# Patient Record
Sex: Female | Born: 1995 | Race: White | Hispanic: No | Marital: Single | Smoking: Former smoker
Health system: Southern US, Community
[De-identification: ages and names within clinical notes are randomized; demographics above are authoritative.]

## PROBLEM LIST (undated history)

## (undated) ENCOUNTER — Inpatient Hospital Stay (HOSPITAL_COMMUNITY): Payer: Self-pay

## (undated) DIAGNOSIS — N390 Urinary tract infection, site not specified: Secondary | ICD-10-CM

## (undated) DIAGNOSIS — F419 Anxiety disorder, unspecified: Secondary | ICD-10-CM

## (undated) DIAGNOSIS — F32A Depression, unspecified: Secondary | ICD-10-CM

## (undated) DIAGNOSIS — Z789 Other specified health status: Secondary | ICD-10-CM

## (undated) DIAGNOSIS — I499 Cardiac arrhythmia, unspecified: Secondary | ICD-10-CM

## (undated) DIAGNOSIS — G43909 Migraine, unspecified, not intractable, without status migrainosus: Secondary | ICD-10-CM

## (undated) DIAGNOSIS — B009 Herpesviral infection, unspecified: Secondary | ICD-10-CM

## (undated) HISTORY — PX: TYMPANOSTOMY TUBE PLACEMENT: SHX32

---
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
## 2017-01-29 ENCOUNTER — Ambulatory Visit (INDEPENDENT_AMBULATORY_CARE_PROVIDER_SITE_OTHER): Payer: Medicaid Other | Admitting: Family

## 2017-01-29 ENCOUNTER — Encounter: Payer: Self-pay | Admitting: Family

## 2017-01-29 VITALS — BP 124/83 | Ht 63.98 in | Wt 161.0 lb

## 2017-01-29 DIAGNOSIS — Z3046 Encounter for surveillance of implantable subdermal contraceptive: Secondary | ICD-10-CM

## 2017-01-29 DIAGNOSIS — Z113 Encounter for screening for infections with a predominantly sexual mode of transmission: Secondary | ICD-10-CM

## 2017-01-29 DIAGNOSIS — Z30017 Encounter for initial prescription of implantable subdermal contraceptive: Secondary | ICD-10-CM

## 2017-01-29 MED ORDER — ETONOGESTREL 68 MG ~~LOC~~ IMPL
68.0000 mg | DRUG_IMPLANT | Freq: Once | SUBCUTANEOUS | Status: AC
  Administered 2017-01-29: 68 mg via SUBCUTANEOUS

## 2017-01-29 NOTE — Progress Notes (Signed)
THIS RECORD MAY CONTAIN CONFIDENTIAL INFORMATION THAT SHOULD NOT BE RELEASED WITHOUT REVIEW OF THE SERVICE PROVIDER.  Adolescent Medicine Consultation Follow-Up Visit Lauren [name]  is a 21 y.o. female referred by No ref. provider found here today for follow-up regarding nexplanon removal and reinsertion.    Last seen in [facility] on 05/17/14 for BTB on nexplanon.  Plan at last visit included reassurance re: BTB Nexplanon.   - Pertinent Labs? No - Growth Chart Viewed? no   History was provided by the patient.  PCP Confirmed?  yes  My Chart Activated?   no    Chief Complaint  Patient presents with  . Follow-up  . Contraception    nexplanon remove and replace.    HPI:    Needs nexplanon replaced. Would like to have nexplanon reinserted.  No other complaints.   No LMP recorded. Patient has had an implant. Allergies  Allergen Reactions  . Septra [Bactrim] Nausea And Vomiting  . Sulfa Antibiotics Anaphylaxis and Nausea And Vomiting  . Prednisone Other (See Comments)    Extreme crying  . Other Itching, Swelling and Rash    TACO SEASONING ([employer] BRAND FROM [employer])   Outpatient Medications Prior to Visit  Medication Sig Dispense Refill  . etonogestrel (NEXPLANON) 68 MG IMPL implant 68 mg by Subdermal route once. IMPLANTED EVERY 3 YEARS    . azithromycin (ZITHROMAX) 250 MG tablet Take 1 tablet (250 mg total) by mouth daily. Take first 2 tablets together, then 1 every day until finished. (Patient not taking: Reported on 09/14/2016) 6 tablet 0  . benzonatate (TESSALON) 100 MG capsule Take 2 capsules (200 mg total) by mouth 2 (two) times daily as needed for cough. (Patient not taking: Reported on 09/14/2016) 20 capsule 0  . cephALEXin (KEFLEX) 500 MG capsule Take 1 capsule (500 mg total) by mouth 3 (three) times daily. (Patient not taking: Reported on 01/29/2017) 21 capsule 0  . lidocaine (XYLOCAINE) 2 % solution Use as directed 20 mLs in the mouth or throat  as needed for mouth pain. (Patient not taking: Reported on 09/14/2016) 100 mL 0  . loratadine (CLARITIN) 10 MG tablet Take 10 mg by mouth daily.    . methocarbamol (ROBAXIN) 500 MG tablet Take 1 tablet (500 mg total) by mouth 2 (two) times daily. (Patient not taking: Reported on 09/14/2016) 20 tablet 0  . naproxen (NAPROSYN) 500 MG tablet Take 1 tablet (500 mg total) by mouth 2 (two) times daily. (Patient not taking: Reported on 09/14/2016) 30 tablet 0  . nitrofurantoin, macrocrystal-monohydrate, (MACROBID) 100 MG capsule Take 1 capsule (100 mg total) by mouth 2 (two) times daily. (Patient not taking: Reported on 09/14/2016) 14 capsule 0  . oxymetazoline (AFRIN NASAL SPRAY) 0.05 % nasal spray Place 1 spray into both nostrils 2 (two) times daily. Do not use spray for more than 3 days to prevent rebound rhinorrhea. (Patient not taking: Reported on 09/14/2016) 30 mL 0  . phenazopyridine (PYRIDIUM) 200 MG tablet Take 1 tablet (200 mg total) by mouth 3 (three) times daily. (Patient not taking: Reported on 09/14/2016) 6 tablet 0  . sodium chloride (OCEAN) 0.65 % SOLN nasal spray Place 1 spray into both nostrils as needed for congestion. (Patient not taking: Reported on 01/29/2017) 480 mL 0  . traMADol (ULTRAM) 50 MG tablet Take 1 tablet (50 mg total) by mouth 2 (two) times daily. (Patient not taking: Reported on 09/14/2016) 10 tablet 0   No facility-administered medications prior to visit.  Patient Active Problem List   Diagnosis Date Noted  . Migraine without aura and without status migrainosus, not intractable 02/26/2015  . Episodic tension-type headache, not intractable 02/26/2015  . Poor sleep hygiene 02/26/2015  . Presence of subdermal contraceptive implant 03/08/2014  . Irregular menses 03/08/2014  . Acne 03/08/2014  . Chronic headaches 10/28/2013  . 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 Problems with learning 09/21/2013    The following portions of the patient's history were reviewed and updated as appropriate: allergies, current medications, past medical history and problem list.  Physical Exam:  Vitals:   01/29/17 1507  BP: 124/83  Weight: 161 lb (73 kg)  Height: 5' 3.98" (1.625 m)   BP 124/83 (BP Location: Right Arm, Patient Position: Sitting, Cuff Size: Large)   Ht 5' 3.98" (1.625 m)   Wt 161 lb (73 kg)   BMI 27.66 kg/m  Body mass index: body mass index is 27.66 kg/m. Blood pressure percentiles are 91 % systolic and 96 % diastolic based on NHBPEP's 4th Report. Blood pressure percentile targets: 90: 123/78, 95: 127/82, 99 + 5 mmHg: 139/95.  Physical Exam  Constitutional: She is oriented to person, place, and time. She appears well-developed. No distress.  HENT:  Head: Normocephalic and atraumatic.  Eyes: EOM are normal. Pupils are equal, round, and reactive to light. No scleral icterus.  Cardiovascular: Normal rate and regular rhythm.   No murmur heard. Pulmonary/Chest: Effort normal and breath sounds normal.  Abdominal: Soft.  Musculoskeletal: Normal range of motion. She exhibits no edema.  Lymphadenopathy:    She has no cervical adenopathy.  Neurological: She is alert and oriented to person, place, and time. No cranial nerve deficit.  Skin: Skin is warm and dry. No rash noted.  Psychiatric: She has a normal mood and affect. Her behavior is normal. Judgment and thought content normal.    Assessment/Plan: 1. Encounter for Nexplanon removal Risks & benefits of Nexplanon removal discussed. Consent form signed.  The patient denies any allergies to anesthetics or antiseptics.  Procedure: Pt was placed in supine position. left arm was flexed at the elbow and externally rotated so that her wrist was parallel to her ear, The device was palpated and marked. The site was cleaned with Betadine. The area surrounding the device was covered with a sterile drape. 1% lidocaine was injected just  under the device. A scalpel was used to create a small incision. The device was pushed towards the incision. Fibrous tissue surrounding the device was gradually removed from the device. The device was removed and measured to ensure all 4 cm of device was removed. Steri-strips were used to close the incision. Pressure dressing was applied to the patient.  The patient was instructed to removed the pressure dressing in 24 hrs.  The patient was advised to move slowly from a supine to an upright position  The patient denied any concerns or complaints  The patient was instructed to schedule a follow-up appt in 1 month. The patient will be called in 1 week to address any concerns.  2. Nexplanon insertion See procedure note - etonogestrel (NEXPLANON) implant 68 mg; 68 mg by Subdermal route once. - Subdermal Etonogestrel Implant Insertion  3. Routine screening for STI (sexually transmitted infection) Will screen per protocol  - GC/Chlamydia Probe Amp  Follow-up:  Return as needed.    Medical decision-making:  >25 minutes spent face to face with patient with more than 50% of appointment spent  discussing diagnosis, management, follow-up, and reviewing of return precautions.

## 2017-01-29 NOTE — Patient Instructions (Addendum)
Call if you have any questions or concerns.   Congratulations on getting your Nexplanon placement!  Below is some important information about Nexplanon.  First remember that Nexplanon does not prevent sexually transmitted infections.  Condoms will help prevent sexually transmitted infections. The Nexplanon starts working 7 days after it was inserted.  There is a risk of getting pregnant if you have unprotected sex in those first 7 days after placement of the Nexplanon.  The Nexplanon lasts for 3 years but can be removed at any time.  You can become pregnant as early as 1 week after removal.  You can have a new Nexplanon put in after the old one is removed if you like.  It is not known whether Nexplanon is as effective in women who are very overweight because the studies did not include many overweight women.  Nexplanon interacts with some medications, including barbiturates, bosentan, carbamazepine, felbamate, griseofulvin, oxcarbazepine, phenytoin, rifampin, St. John's wort, topiramate, HIV medicines.  Please alert your doctor if you are on any of these medicines.  Always tell other healthcare providers that you have a Nexplanon in your arm.  The Nexplanon was placed just under the skin.  Leave the outside bandage on for 24 hours.  Leave the smaller bandage on for 3-5 days or until it falls off on its own.  Keep the area clean and dry for 3-5 days. There is usually bruising or swelling at the insertion site for a few days to a week after placement.  If you see redness or pus draining from the insertion site, call us immediately.  Keep your user card with the date the implant was placed and the date the implant is to be removed.  The most common side effect is a change in your menstrual bleeding pattern.   This bleeding is generally not harmful to you but can be annoying.  Call or come in to see us if you have any concerns about the bleeding or if you have any side effects or questions.    We  will call you in 1 week to check in and we would like you to return to the clinic for a follow-up visit in 1 month.  You can call [facility] 24 hours a day with any questions or concerns.  There is always a nurse or doctor available to take your call.  Call 9-1-1 if you have a life-threatening emergency.  For anything else, please call us at [phone] before heading to the ER.

## 2017-01-30 LAB — GC/CHLAMYDIA PROBE AMP
CT PROBE, AMP APTIMA: DETECTED — AB
GC Probe RNA: NOT DETECTED

## 2017-02-01 ENCOUNTER — Telehealth: Payer: Self-pay | Admitting: *Deleted

## 2017-02-01 NOTE — Progress Notes (Signed)
Please notify pt she is positive for chlamydia.  She can come in for treatment or I will send pills to pharmacy.  Her partner(s) need to also be treated. Please advise if she would like EPT as well.  She should avoid sexual contact for 7 days after she and her partner(s) treated  Patient aware coming in on 02/03/17 to be seen

## 2017-02-01 NOTE — Telephone Encounter (Signed)
Spoke to patient and she is aware of results. Patient wants to come to the office to be treated.

## 2017-02-01 NOTE — Telephone Encounter (Signed)
-----   Message from Christianne Dolinhristy Millican, NP sent at 01/31/2017  7:30 PM EDT ----- Please notify pt she is positive for chlamydia.  She can come in for treatment or I will send pills to pharmacy.  Her partner(s) need to also be treated. Please advise if she would like EPT as well.  She should avoid sexual contact for 7 days after she and her partner(s) treated.

## 2017-02-02 MED ORDER — ETONOGESTREL 68 MG ~~LOC~~ IMPL: 68.0000 mg | DRUG_IMPLANT | Freq: Once | SUBCUTANEOUS | Status: DC

## 2017-02-02 NOTE — Procedures (Signed)
Nexplanon Insertion  No contraindications for placement.  No liver disease, no unexplained vaginal bleeding, no h/o breast cancer, no h/o blood clots.  No LMP recorded. Patient has had an implant.  UHCG: negative   Last Unprotected sex:  n/a  Risks & benefits of Nexplanon discussed The nexplanon device [facility] Packaging instructions supplied to patient Consent form signed  The patient denies any allergies to anesthetics or antiseptics.  Procedure: Pt was placed in supine position. The left arm was flexed at the elbow and externally rotated so that her wrist was parallel to her ear The medial epicondyle of the left arm was identified The insertions site was marked 8 cm proximal to the medial epicondyle The insertion site was cleaned with Betadine The area surrounding the insertion site was covered with a sterile drape 1% lidocaine was injected just under the skin at the insertion site extending 4 cm proximally. The sterile preloaded disposable Nexaplanon applicator was removed from the sterile packaging The applicator needle was inserted at a 30 degree angle at 8 cm proximal to the medial epicondyle as marked The applicator was lowered to a horizontal position and advanced just under the skin for the full length of the needle The slider on the applicator was retracted fully while the applicator remained in the same position, then the applicator was removed. The implant was confirmed via palpation as being in position The implant position was demonstrated to the patient Pressure dressing was applied to the patient.  The patient was instructed to removed the pressure dressing in 24 hrs.  The patient was advised to move slowly from a supine to an upright position  The patient denied any concerns or complaints  The patient was instructed to schedule a follow-up appt in 1 month and to call sooner if any concerns.  The patient acknowledged agreement and  understanding of the plan.

## 2017-02-03 ENCOUNTER — Ambulatory Visit (INDEPENDENT_AMBULATORY_CARE_PROVIDER_SITE_OTHER): Payer: Medicaid Other | Admitting: Family

## 2017-02-03 ENCOUNTER — Encounter: Payer: Self-pay | Admitting: Family

## 2017-02-03 VITALS — BP 118/75 | HR 119 | Ht 64.0 in | Wt 162.0 lb

## 2017-02-03 DIAGNOSIS — A749 Chlamydial infection, unspecified: Secondary | ICD-10-CM

## 2017-02-03 MED ORDER — AZITHROMYCIN 500 MG PO TABS
1000.0000 mg | ORAL_TABLET | Freq: Once | ORAL | Status: AC
  Administered 2017-02-03: 1000 mg via ORAL

## 2017-02-03 NOTE — Progress Notes (Signed)
THIS RECORD MAY CONTAIN CONFIDENTIAL INFORMATION THAT SHOULD NOT BE RELEASED WITHOUT REVIEW OF THE SERVICE PROVIDER.  Adolescent Medicine Consultation Follow-Up Visit Lauren [name]  is a 21 y.o. female referred by No ref. provider found here today for follow-up regarding a positive chlamydia test.    Last seen in [facility] on 01/29/17 for Nexplanon placement and STI testing.  - Pertinent Labs? Yes, positive chlamydia  - Growth Chart Viewed? yes   History was provided by the patient.  PCP Confirmed?  yes  Chief Complaint  Patient presents with  . STI TREATMENT    HPI:    Patient states she heard about her partner's chlamydia status from another girl on Saturday, but felt it may be a rumor. She's no longer involved with him. No other sexual partners recently.  No LMP recorded. Patient has had an implant. Allergies  Allergen Reactions  . Septra [Bactrim] Nausea And Vomiting  . Sulfa Antibiotics Anaphylaxis and Nausea And Vomiting  . Prednisone Other (See Comments)    Extreme crying  . Other Itching, Swelling and Rash    TACO SEASONING ([employer] BRAND FROM [employer])   Outpatient Medications Prior to Visit  Medication Sig Dispense Refill  . azithromycin (ZITHROMAX) 250 MG tablet Take 1 tablet (250 mg total) by mouth daily. Take first 2 tablets together, then 1 every day until finished. 6 tablet 0  . benzonatate (TESSALON) 100 MG capsule Take 2 capsules (200 mg total) by mouth 2 (two) times daily as needed for cough. 20 capsule 0  . cephALEXin (KEFLEX) 500 MG capsule Take 1 capsule (500 mg total) by mouth 3 (three) times daily. 21 capsule 0  . lidocaine (XYLOCAINE) 2 % solution Use as directed 20 mLs in the mouth or throat as needed for mouth pain. 100 mL 0  . loratadine (CLARITIN) 10 MG tablet Take 10 mg by mouth daily.    . methocarbamol (ROBAXIN) 500 MG tablet Take 1 tablet (500 mg total) by mouth 2 (two) times daily. 20 tablet 0  . naproxen (NAPROSYN) 500 MG  tablet Take 1 tablet (500 mg total) by mouth 2 (two) times daily. 30 tablet 0  . nitrofurantoin, macrocrystal-monohydrate, (MACROBID) 100 MG capsule Take 1 capsule (100 mg total) by mouth 2 (two) times daily. 14 capsule 0  . oxymetazoline (AFRIN NASAL SPRAY) 0.05 % nasal spray Place 1 spray into both nostrils 2 (two) times daily. Do not use spray for more than 3 days to prevent rebound rhinorrhea. 30 mL 0  . phenazopyridine (PYRIDIUM) 200 MG tablet Take 1 tablet (200 mg total) by mouth 3 (three) times daily. 6 tablet 0  . sodium chloride (OCEAN) 0.65 % SOLN nasal spray Place 1 spray into both nostrils as needed for congestion. 480 mL 0  . traMADol (ULTRAM) 50 MG tablet Take 1 tablet (50 mg total) by mouth 2 (two) times daily. 10 tablet 0   No facility-administered medications prior to visit.      Patient Active Problem List   Diagnosis Date Noted  . Migraine without aura and without status migrainosus, not intractable 02/26/2015  . Episodic tension-type headache, not intractable 02/26/2015  . Poor sleep hygiene 02/26/2015  . Presence of subdermal contraceptive implant 03/08/2014  . Irregular menses 03/08/2014  . Acne 03/08/2014  . Chronic headaches 10/28/2013  . 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 Problems with learning 09/21/2013    Physical Exam:  Vitals:   02/03/17 1420 02/03/17  1422  BP: 130/80 118/75  Pulse: (!) 120 (!) 119  Weight: 162 lb (73.5 kg)   Height: 5\' 4"  (1.626 m)    BP 118/75 (BP Location: Right Arm, Patient Position: Sitting, Cuff Size: Large)   Pulse (!) 119   Ht 5\' 4"  (1.626 m)   Wt 162 lb (73.5 kg)   BMI 27.81 kg/m  Body mass index: body mass index is 27.81 kg/m. Blood pressure percentiles are 78 % systolic and 84 % diastolic based on NHBPEP's 4th Report. Blood pressure percentile targets: 90: 123/78, 95: 127/82, 99 + 5 mmHg: 139/95.   Physical Exam  Constitutional: She appears  well-developed and well-nourished. No distress.  Skin: She is not diaphoretic.  Psychiatric: She has a normal mood and affect. Her behavior is normal. Judgment and thought content normal.     Assessment/Plan: 1. Positive chlamydia test: Given azithromycin 1000mg  orally.  -Discussed having partner treated.   -Avoid sexual activity until 7 days after all partners treated.  -Patient to return in 3 months for a test of re-infection.    Follow-up:  3 months  Joanna Puff, MD [facility] Resident  02/03/2017, 2:55 PM

## 2017-02-03 NOTE — Patient Instructions (Signed)
Chlamydia, Female Chlamydia is an STD (sexually transmitted disease). This is an infection that spreads through sexual contact. If it is not treated, it can cause serious problems. It must be treated with antibiotic medicine. Sometimes, you may not have symptoms (asymptomatic). When you have symptoms, they can include:  Burning when you pee (urinate).  Peeing often.  Fluid (discharge) coming from the vagina.  Redness, soreness, and swelling (inflammation) of the butt (rectum).  Bleeding or fluid coming from the butt.  Belly (abdominal) pain.  Pain during sex.  Bleeding between periods.  Itching, burning, or redness in the eyes.  Fluid coming from the eyes.  Follow these instructions at home: Medicines  Take over-the-counter and prescription medicines only as told by your doctor.  Take your antibiotic medicine as told by your doctor. Do not stop taking the antibiotic even if you start to feel better. Sexual activity  Tell sex partners about your infection. Sex partners are people you had oral, anal, or vaginal sex with within 60 days of when you started getting sick. They need treatment, too.  Do not have sex until: ? You and your sex partners have been treated. ? Your doctor says it is okay.  If you have a single dose treatment, wait 7 days before having sex. General instructions  It is up to you to get your test results. Ask your doctor when your results will be ready.  Get a lot of rest.  Eat healthy foods.  Drink enough fluid to keep your pee (urine) clear or pale yellow.  Keep all follow-up visits as told by your doctor. You may need tests after 3 months. Preventing chlamydia  The only way to prevent chlamydia is not to have sex. To lower your risk: ? Use latex condoms correctly. Do this every time you have sex. ? Avoid having many sex partners. ? Ask if your partner has been tested for STDs and if he or she had negative results. Contact a doctor if:  You  get new symptoms.  You do not get better with treatment.  You have a fever or chills.  You have pain during sex. Get help right away if:  Your pain gets worse and does not get better with medicine.  You get flu-like symptoms, such as: ? Night sweats. ? Sore throat. ? Muscle aches.  You feel sick to your stomach (nauseous).  You throw up (vomit).  You have trouble swallowing.  You have bleeding: ? Between periods. ? After sex.  You have irregular periods.  You have belly pain that does not get better with medicine.  You have lower back pain that does not get better with medicine.  You feel weak or dizzy.  You pass out (faint).  You are pregnant and you get symptoms of chlamydia. Summary  Chlamydia is an infection that spreads through sexual contact.  Sometimes, chlamydia can cause no symptoms (asymptomatic).  Do not have sex until your doctor says it is okay.  All sex partners will have to be treated for chlamydia. This information is not intended to replace advice given to you by your health care provider. Make sure you discuss any questions you have with your health care provider. Document Released: 08/04/2008 Document Revised: 10/15/2016 Document Reviewed: 10/15/2016 Elsevier Interactive Patient Education  2017 [employer]vier Inc.  

## 2017-06-08 ENCOUNTER — Emergency Department (HOSPITAL_COMMUNITY)
Admission: EM | Admit: 2017-06-08 | Discharge: 2017-06-08 | Disposition: A | Discharge status: Home / Self Care | Payer: Medicaid Other | Attending: Emergency Medicine | Admitting: Emergency Medicine

## 2017-06-08 DIAGNOSIS — F909 Attention-deficit hyperactivity disorder, unspecified type: Secondary | ICD-10-CM | POA: Diagnosis not present

## 2017-06-08 DIAGNOSIS — J029 Acute pharyngitis, unspecified: Secondary | ICD-10-CM | POA: Diagnosis present

## 2017-06-08 DIAGNOSIS — H6691 Otitis media, unspecified, right ear: Secondary | ICD-10-CM | POA: Diagnosis not present

## 2017-06-08 DIAGNOSIS — Z79899 Other long term (current) drug therapy: Secondary | ICD-10-CM | POA: Diagnosis not present

## 2017-06-08 DIAGNOSIS — R509 Fever, unspecified: Secondary | ICD-10-CM | POA: Diagnosis not present

## 2017-06-08 DIAGNOSIS — Z7722 Contact with and (suspected) exposure to environmental tobacco smoke (acute) (chronic): Secondary | ICD-10-CM | POA: Diagnosis not present

## 2017-06-08 LAB — RAPID STREP SCREEN (MED CTR MEBANE ONLY): STREPTOCOCCUS, GROUP A SCREEN (DIRECT): NEGATIVE

## 2017-06-08 MED ORDER — AMOXICILLIN-POT CLAVULANATE 875-125 MG PO TABS: 1.0000 | ORAL_TABLET | Freq: Two times a day (BID) | ORAL | 0 refills | Status: DC

## 2017-06-08 MED ORDER — ACETAMINOPHEN 325 MG PO TABS
650.0000 mg | ORAL_TABLET | Freq: Once | ORAL | Status: AC
  Administered 2017-06-08: 650 mg via ORAL

## 2017-06-08 MED ORDER — ONDANSETRON 4 MG PO TBDP
ORAL_TABLET | ORAL | Status: AC
  Filled 2017-06-08: qty 1

## 2017-06-08 MED ORDER — IBUPROFEN 800 MG PO TABS
800.0000 mg | ORAL_TABLET | Freq: Once | ORAL | Status: AC
  Administered 2017-06-08: 800 mg via ORAL
  Filled 2017-06-08: qty 1

## 2017-06-08 MED ORDER — ONDANSETRON 4 MG PO TBDP
4.0000 mg | ORAL_TABLET | Freq: Once | ORAL | Status: AC
  Administered 2017-06-08: 4 mg via ORAL

## 2017-06-08 MED ORDER — ACETAMINOPHEN 325 MG PO TABS
ORAL_TABLET | ORAL | Status: AC
  Filled 2017-06-08: qty 2

## 2017-06-08 MED ORDER — MAGIC MOUTHWASH W/LIDOCAINE: 5.0000 mL | Freq: Four times a day (QID) | ORAL | 0 refills | Status: DC | PRN

## 2017-06-08 MED ORDER — IBUPROFEN 600 MG PO TABS: 600.0000 mg | ORAL_TABLET | Freq: Four times a day (QID) | ORAL | 0 refills | Status: DC | PRN

## 2017-06-08 MED ORDER — GI COCKTAIL ~~LOC~~
30.0000 mL | Freq: Once | ORAL | Status: AC
  Administered 2017-06-08: 30 mL via ORAL
  Filled 2017-06-08: qty 30

## 2017-06-08 MED ORDER — AMOXICILLIN-POT CLAVULANATE 875-125 MG PO TABS
1.0000 | ORAL_TABLET | Freq: Once | ORAL | Status: AC
  Administered 2017-06-08: 1 via ORAL
  Filled 2017-06-08: qty 1

## 2017-06-08 NOTE — ED Provider Notes (Signed)
[facility]-EMERGENCY DEPT Provider Note   CSN: [id number] Arrival date & time: 06/08/17  1854     History   Chief Complaint Chief Complaint  Patient presents with  . Fever  . Sore Throat    HPI Lauren [name] is a 21 y.o. female.  21 year old female with a history of dysthymia and migraines presents to the emergency department for complaints of sore throat. Sore throat has been present over the last 2 days. Patient noticed onset of fever today up to maximum temperature 103F. Patient has been taking Tylenol. She last took this medication this morning. She reports worsening pain with swallowing. She has also developed generalized body aches as well as nasal congestion. She denies sick contacts, cough, vomiting, diarrhea. No inability to swallow or drooling.   The history is provided by the patient. No language interpreter was used.  Fever    Sore Throat     Past Medical History:  Diagnosis Date  . Dysrhythmia   . Medical history non-contributory   . Migraines     Patient Active Problem List   Diagnosis Date Noted  . Migraine without aura and without status migrainosus, not intractable 02/26/2015  . Episodic tension-type headache, not intractable 02/26/2015  . Poor sleep hygiene 02/26/2015  . Presence of subdermal contraceptive implant 03/08/2014  . Irregular menses 03/08/2014  . Acne 03/08/2014  . Chronic headaches 10/28/2013  . 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 Problems with learning 09/21/2013    Past Surgical History:  Procedure Laterality Date  . ADENOIDECTOMY W/ MYRINGOTOMY  6768   21 Years old   . TONSILLECTOMY  9092   21 Years old   . TYMPANOSTOMY TUBE PLACEMENT Bilateral 1999, 20061, 6056   84 weeks old, 21 year old and 21 years old   . WISDOM TOOTH EXTRACTION  4433   21 Years old     OB History    Gravida Para Term Preterm AB Living   0 0 0 0 0 0   SAB TAB Ectopic Multiple Live  Births   0 0 0 0         Home Medications    Prior to Admission medications   Medication Sig Start Date End Date Taking? Authorizing Provider  amoxicillin-clavulanate (AUGMENTIN) 875-125 MG tablet Take 1 tablet by mouth every 12 (twelve) hours. 06/08/17   Antony Madura, PA-C  azithromycin (ZITHROMAX) 250 MG tablet Take 1 tablet (250 mg total) by mouth daily. Take first 2 tablets together, then 1 every day until finished. 08/03/16   Barrett Henle, PA-C  benzonatate (TESSALON) 100 MG capsule Take 2 capsules (200 mg total) by mouth 2 (two) times daily as needed for cough. 08/03/16   Barrett Henle, PA-C  cephALEXin (KEFLEX) 500 MG capsule Take 1 capsule (500 mg total) by mouth 3 (three) times daily. 09/14/16   Horton, Mayer Masker, MD  ibuprofen (ADVIL,MOTRIN) 600 MG tablet Take 1 tablet (600 mg total) by mouth every 6 (six) hours as needed. 06/08/17   Antony Madura, PA-C  lidocaine (XYLOCAINE) 2 % solution Use as directed 20 mLs in the mouth or throat as needed for mouth pain. 04/14/16   Cheri Fowler, PA-C  loratadine (CLARITIN) 10 MG tablet Take 10 mg by mouth daily.    [provider]  magic mouthwash w/lidocaine SOLN Take 5 mLs by mouth 4 (four) times daily as needed (sore throat). Gargle and swallow as prescribed for sore throat 06/08/17  Antony MaduraHumes, Esta Carmon, PA-C  methocarbamol (ROBAXIN) 500 MG tablet Take 1 tablet (500 mg total) by mouth 2 (two) times daily. 07/20/16   Sam, Ace GinsSerena Y, PA-C  naproxen (NAPROSYN) 500 MG tablet Take 1 tablet (500 mg total) by mouth 2 (two) times daily. 07/20/16   Sam, Ace GinsSerena Y, PA-C  nitrofurantoin, macrocrystal-monohydrate, (MACROBID) 100 MG capsule Take 1 capsule (100 mg total) by mouth 2 (two) times daily. 05/23/16   Aviva SignsWilliams, Marie L, CNM  oxymetazoline (AFRIN NASAL SPRAY) 0.05 % nasal spray Place 1 spray into both nostrils 2 (two) times daily. Do not use spray for more than 3 days to prevent rebound rhinorrhea. 08/03/16   Barrett HenleNadeau, Nicole Elizabeth,  PA-C  phenazopyridine (PYRIDIUM) 200 MG tablet Take 1 tablet (200 mg total) by mouth 3 (three) times daily. 05/23/16   Aviva SignsWilliams, Marie L, CNM  sodium chloride (OCEAN) 0.65 % SOLN nasal spray Place 1 spray into both nostrils as needed for congestion. 09/14/16   Horton, Mayer Maskerourtney F, MD  traMADol (ULTRAM) 50 MG tablet Take 1 tablet (50 mg total) by mouth 2 (two) times daily. 07/28/16   Deborha PaymentMeyer, Ashley L, PA-C    Family History Family History  Problem Relation Age of Onset  . Migraines Mother   . Menstrual problems Mother   . Diabetes Mother   . Anxiety disorder Mother        xanax  . Depression Mother        prozac  . Thyroid disease Sister   . Diabetes Maternal Grandmother   . Dementia Maternal Grandmother        Died at 4181  . Diabetes Maternal Grandfather   . Emphysema Maternal Grandfather        Died at 5569  . Febrile seizures Brother        Died at the age of 19 years old  . Pneumonia Paternal Grandmother        Died at 4247  . Heart failure Paternal Grandfather        Died at 7682    Social History Social History  Substance Use Topics  . Smoking status: Passive Smoke Exposure - Never Smoker  . Smokeless tobacco: Never Used     Comment: Mom smokes   . Alcohol use No     Allergies   Septra [bactrim]; Sulfa antibiotics; Prednisone; Codeine; and Other   Review of Systems Review of Systems  Constitutional: Positive for fever.   Ten systems reviewed and are negative for acute change, except as noted in the HPI.    Physical Exam Updated Vital Signs BP 93/61 (BP Location: Right Arm)   Pulse 100   Temp 99.9 F (37.7 C) (Oral)   Resp 18   Ht 5\' 3"  (1.6 m)   Wt 74.8 kg (165 lb)   SpO2 97%   BMI 29.23 kg/m   Physical Exam  Constitutional: She is oriented to person, place, and time. She appears well-developed and well-nourished. No distress.  Nontoxic and in no acute distress  HENT:  Head: Normocephalic and atraumatic.  Erythema noted to the right ear canal and tympanic  membrane. Currently obscured on the right. No perforation of the right TM. Patient noted to have nasal congestion. Oropharynx patent with posterior oropharyngeal erythema. There is palatal petechiae. Uvula midline. Patient tolerating secretions without difficulty. No tripoding or stridor.  Eyes: Conjunctivae and EOM are normal. No scleral icterus.  Neck: Normal range of motion.  No nuchal rigidity or meningismus  Cardiovascular: Regular rhythm and intact distal  pulses.   Patient not tachycardic as noted in triage  Pulmonary/Chest: Effort normal. No respiratory distress. She has no wheezes.  Respirations even and unlabored  Musculoskeletal: Normal range of motion.  Neurological: She is alert and oriented to person, place, and time. She exhibits normal muscle tone. Coordination normal.  Skin: Skin is warm and dry. No rash noted. She is not diaphoretic. No erythema. No pallor.  Psychiatric: She has a normal mood and affect. Her behavior is normal.  Nursing note and vitals reviewed.    ED Treatments / Results  Labs (all labs ordered are listed, but only abnormal results are displayed) Labs Reviewed  RAPID STRE[facility] SCREEN (NOT [facility])  CULTURE, GROUP A STREP [facility])    EKG  EKG Interpretation None       Radiology No results found.  Procedures Procedures (including critical care time)  Medications Ordered in ED Medications  acetaminophen (TYLENOL) 325 MG tablet (not administered)  ondansetron (ZOFRAN-ODT) 4 MG disintegrating tablet (not administered)  acetaminophen (TYLENOL) 325 MG tablet (not administered)  amoxicillin-clavulanate (AUGMENTIN) 875-125 MG per tablet 1 tablet (not administered)  gi cocktail (Maalox,Lidocaine,Donnatal) (not administered)  ibuprofen (ADVIL,MOTRIN) tablet 800 mg (not administered)  acetaminophen (TYLENOL) tablet 650 mg (650 mg Oral Given 06/08/17 2118)  ondansetron (ZOFRAN-ODT) disintegrating tablet 4 mg (4 mg Oral Given 06/08/17 2118)     Initial  Impression / Assessment and Plan / ED Course  I have reviewed the triage vital signs and the nursing notes.  Pertinent labs & imaging results that were available during my care of the patient were reviewed by me and considered in my medical decision making (see chart for details).     21 year old female presents to the emergency department for fever and sore throat. Symptoms associated with nasal congestion. Patient tolerating secretions. No tripoding. No meningismus. Fever responding to antipyretics given in triage. Centor score 4 with presence of palatal petechiae; high likelihood of strep. Will treat with Augmentin as there is also evidence of early otitis media on exam. Supportive therapy indicated with return if symptoms worsen. Patient discharged in stable condition. Patient and mother with no unaddressed concerns.   Final Clinical Impressions(s) / ED Diagnoses   Final diagnoses:  Pharyngitis, unspecified etiology  Right otitis media, unspecified otitis media type  Febrile illness    New Prescriptions New Prescriptions   AMOXICILLIN-CLAVULANATE (AUGMENTIN) 875-125 MG TABLET    Take 1 tablet by mouth every 12 (twelve) hours.   IBUPROFEN (ADVIL,MOTRIN) 600 MG TABLET    Take 1 tablet (600 mg total) by mouth every 6 (six) hours as needed.   MAGIC MOUTHWASH W/LIDOCAINE SOLN    Take 5 mLs by mouth 4 (four) times daily as needed (sore throat). Gargle and swallow as prescribed for sore throat     Antony MaduraHumes, Farhaan Mabee, PA-C 06/08/17 2342    Mancel BaleWentz, Elliott, MD 06/10/17 0830

## 2017-06-08 NOTE — ED Notes (Signed)
Patient's mother up to desk, updated on delays and apologized for wait

## 2017-06-08 NOTE — ED Triage Notes (Signed)
Pt c/o sore throat for 2 days and fever that started last night, c/o generalized body aches

## 2017-06-11 LAB — CULTURE, GROUP A STREP (THRC)

## 2017-07-02 ENCOUNTER — Ambulatory Visit (INDEPENDENT_AMBULATORY_CARE_PROVIDER_SITE_OTHER): Payer: Medicaid Other

## 2017-07-02 ENCOUNTER — Ambulatory Visit (HOSPITAL_COMMUNITY)
Admission: EM | Admit: 2017-07-02 | Discharge: 2017-07-02 | Disposition: A | Discharge status: Home / Self Care | Payer: Medicaid Other | Attending: Internal Medicine | Admitting: Internal Medicine

## 2017-07-02 ENCOUNTER — Encounter (HOSPITAL_COMMUNITY): Payer: Self-pay | Admitting: *Deleted

## 2017-07-02 DIAGNOSIS — S60221A Contusion of right hand, initial encounter: Secondary | ICD-10-CM | POA: Diagnosis not present

## 2017-07-02 DIAGNOSIS — W2209XA Striking against other stationary object, initial encounter: Secondary | ICD-10-CM | POA: Diagnosis not present

## 2017-07-02 DIAGNOSIS — M79641 Pain in right hand: Secondary | ICD-10-CM | POA: Diagnosis not present

## 2017-07-02 NOTE — ED Triage Notes (Signed)
Patient reports striking wall 2 days ago, still having right hand pain. Unable to make fist.

## 2017-07-02 NOTE — ED Provider Notes (Signed)
[facility]    CSN: [id number] Arrival date & time: 07/02/17  1351     History   Chief Complaint Chief Complaint  Patient presents with  . Hand Pain    HPI Lauren [name] is a 21 y.o. female.   The history is provided by the patient. No language interpreter was used.  Hand Pain  This is a new problem. The current episode started 2 days ago. The problem occurs constantly. The problem has been gradually worsening. Pertinent negatives include no chest pain. Nothing aggravates the symptoms. Nothing relieves the symptoms. The treatment provided no relief.  Pt hit a wall.  She complains of pain to lateral aspect of hand  Past Medical History:  Diagnosis Date  . Dysrhythmia   . Medical history non-contributory   . Migraines     Patient Active Problem List   Diagnosis Date Noted  . Migraine without aura and without status migrainosus, not intractable 02/26/2015  . Episodic tension-type headache, not intractable 02/26/2015  . Poor sleep hygiene 02/26/2015  . Presence of subdermal contraceptive implant 03/08/2014  . Irregular menses 03/08/2014  . Acne 03/08/2014  . Chronic headaches 10/28/2013  . 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 Problems with learning 09/21/2013    Past Surgical History:  Procedure Laterality Date  . ADENOIDECTOMY W/ MYRINGOTOMY  957   21 Years old   . TONSILLECTOMY  415   21 Years old   . TYMPANOSTOMY TUBE PLACEMENT Bilateral 1999, 20012, 8063   73 weeks old, 21 year old and 21 years old   . WISDOM TOOTH EXTRACTION  3242   21 Years old     OB History    Gravida Para Term Preterm AB Living   0 0 0 0 0 0   SAB TAB Ectopic Multiple Live Births   0 0 0 0         Home Medications    Prior to Admission medications   Medication Sig Start Date End Date Taking? Authorizing Provider  loratadine (CLARITIN) 10 MG tablet Take 10 mg by mouth daily.   Yes [provider]  amoxicillin-clavulanate (AUGMENTIN) 875-125 MG tablet Take 1 tablet by mouth every 12 (twelve) hours. 06/08/17   Antony Madura, PA-C  azithromycin (ZITHROMAX) 250 MG tablet Take 1 tablet (250 mg total) by mouth daily. Take first 2 tablets together, then 1 every day until finished. 08/03/16   Barrett Henle, PA-C  benzonatate (TESSALON) 100 MG capsule Take 2 capsules (200 mg total) by mouth 2 (two) times daily as needed for cough. 08/03/16   Barrett Henle, PA-C  cephALEXin (KEFLEX) 500 MG capsule Take 1 capsule (500 mg total) by mouth 3 (three) times daily. 09/14/16   Horton, Mayer Masker, MD  ibuprofen (ADVIL,MOTRIN) 600 MG tablet Take 1 tablet (600 mg total) by mouth every 6 (six) hours as needed. 06/08/17   Antony Madura, PA-C  lidocaine (XYLOCAINE) 2 % solution Use as directed 20 mLs in the mouth or throat as needed for mouth pain. 04/14/16   Cheri Fowler, PA-C  magic mouthwash w/lidocaine SOLN Take 5 mLs by mouth 4 (four) times daily as needed (sore throat). Gargle and swallow as prescribed for sore throat 06/08/17   Antony Madura, PA-C  methocarbamol (ROBAXIN) 500 MG tablet Take 1 tablet (500 mg total) by mouth 2 (two) times daily. 07/20/16   Sam, Ace Gins, PA-C  naproxen (NAPROSYN) 500 MG tablet Take 1  tablet (500 mg total) by mouth 2 (two) times daily. 07/20/16   Sam, Ace Gins, PA-C  nitrofurantoin, macrocrystal-monohydrate, (MACROBID) 100 MG capsule Take 1 capsule (100 mg total) by mouth 2 (two) times daily. 05/23/16   Aviva Signs, CNM  oxymetazoline (AFRIN NASAL SPRAY) 0.05 % nasal spray Place 1 spray into both nostrils 2 (two) times daily. Do not use spray for more than 3 days to prevent rebound rhinorrhea. 08/03/16   Barrett Henle, PA-C  phenazopyridine (PYRIDIUM) 200 MG tablet Take 1 tablet (200 mg total) by mouth 3 (three) times daily. 05/23/16   Aviva Signs, CNM  sodium chloride (OCEAN) 0.65 % SOLN nasal spray Place 1 spray into both nostrils as  needed for congestion. 09/14/16   Horton, Mayer Masker, MD  traMADol (ULTRAM) 50 MG tablet Take 1 tablet (50 mg total) by mouth 2 (two) times daily. 07/28/16   Deborha Payment, PA-C    Family History Family History  Problem Relation Age of Onset  . Migraines Mother   . Menstrual problems Mother   . Diabetes Mother   . Anxiety disorder Mother        xanax  . Depression Mother        prozac  . Thyroid disease Sister   . Diabetes Maternal Grandmother   . Dementia Maternal Grandmother        Died at 39  . Diabetes Maternal Grandfather   . Emphysema Maternal Grandfather        Died at 71  . Febrile seizures Brother        Died at the age of 39 years old  . Pneumonia Paternal Grandmother        Died at 83  . Heart failure Paternal Grandfather        Died at 39    Social History Social History  Substance Use Topics  . Smoking status: Passive Smoke Exposure - Never Smoker  . Smokeless tobacco: Never Used     Comment: Mom smokes   . Alcohol use No     Allergies   Septra [bactrim]; Sulfa antibiotics; Prednisone; Codeine; and Other   Review of Systems Review of Systems  Cardiovascular: Negative for chest pain.  All other systems reviewed and are negative.    Physical Exam Triage Vital Signs ED Triage Vitals [07/02/17 1459]  Enc Vitals Group     BP 124/64     Pulse Rate 62     Resp 16     Temp 98 F (36.7 C)     Temp Source Oral     SpO2 100 %     Weight      Height      Head Circumference      Peak Flow      Pain Score 10     Pain Loc      Pain Edu?      Excl. in GC?    No data found.   Updated Vital Signs BP 124/64 (BP Location: Left Arm)   Pulse 62   Temp 98 F (36.7 C) (Oral)   Resp 16   SpO2 100%   Visual Acuity Right Eye Distance:   Left Eye Distance:   Bilateral Distance:    Right Eye Near:   Left Eye Near:    Bilateral Near:     Physical Exam  Constitutional: She appears well-developed and well-nourished.  Musculoskeletal: She  exhibits tenderness.  Tender 5th metacarpal, pain with movement,  nv and  ns intact   Neurological: She is alert.  Skin: Skin is warm.  Psychiatric: She has a normal mood and affect.  Nursing note and vitals reviewed.    UC Treatments / Results  Labs (all labs ordered are listed, but only abnormal results are displayed) Labs Reviewed - No data to display  EKG  EKG Interpretation None       Radiology Dg Hand Complete Right  Result Date: 07/02/2017 CLINICAL DATA:  21 year old female with a history of hand injury and pain. EXAM: RIGHT HAND - COMPLETE 3+ VIEW COMPARISON:  None. FINDINGS: No acute displaced fracture identified. No radiopaque foreign body. No focal soft tissue swelling. Joints are congruent without subluxation/ dislocation. No significant degenerative changes. IMPRESSION: Negative for acute bony abnormality Electronically Signed   By: Gilmer Mor D.O.   On: 07/02/2017 15:32    Procedures Procedures (including critical care time)  Medications Ordered in UC Medications - No data to display   Initial Impression / Assessment and Plan / UC Course  I have reviewed the triage vital signs and the nursing notes.  Pertinent labs & imaging results that were available during my care of the patient were reviewed by me and considered in my medical decision making (see chart for details).       Final Clinical Impressions(s) / UC Diagnoses   Final diagnoses:  Contusion of right hand, initial encounter    New Prescriptions New Prescriptions   No medications on file    Ace wrap Ibuprofen  Controlled Substance Prescriptions [place] Controlled Substance Registry consulted? No   Elson Areas, [place] 07/02/17 1621

## 2018-06-30 ENCOUNTER — Ambulatory Visit (HOSPITAL_COMMUNITY)
Admission: EM | Admit: 2018-06-30 | Discharge: 2018-06-30 | Disposition: A | Discharge status: Home / Self Care | Payer: Medicaid Other | Attending: Family Medicine | Admitting: Family Medicine

## 2018-06-30 ENCOUNTER — Encounter (HOSPITAL_COMMUNITY): Payer: Self-pay

## 2018-06-30 ENCOUNTER — Ambulatory Visit (INDEPENDENT_AMBULATORY_CARE_PROVIDER_SITE_OTHER): Payer: Medicaid Other

## 2018-06-30 ENCOUNTER — Other Ambulatory Visit: Payer: Self-pay

## 2018-06-30 DIAGNOSIS — B9789 Other viral agents as the cause of diseases classified elsewhere: Secondary | ICD-10-CM

## 2018-06-30 DIAGNOSIS — J069 Acute upper respiratory infection, unspecified: Secondary | ICD-10-CM

## 2018-06-30 DIAGNOSIS — R05 Cough: Secondary | ICD-10-CM | POA: Diagnosis not present

## 2018-06-30 MED ORDER — CETIRIZINE-PSEUDOEPHEDRINE ER 5-120 MG PO TB12: 1.0000 | ORAL_TABLET | Freq: Every day | ORAL | 0 refills | Status: DC

## 2018-06-30 MED ORDER — OXYMETAZOLINE HCL 0.05 % NA SOLN: 2.0000 | Freq: Two times a day (BID) | NASAL | 0 refills | Status: AC

## 2018-06-30 NOTE — Discharge Instructions (Addendum)
Chest x-ray negative for cardiopulmonary disease Symptoms most likely due to viral etiology Get plenty of rest and push fluids Zyrtec D prescribed.  Take as directed for symptomatic relief Afrin prescribed.  Use for only three days for symptomatic relief.  Do not use longer, or you may experience a rebound effect Continue to alternate ibuprofen and/or tylenol for body aches and fever Follow up with PCP or Community health and wellness if symptoms persist Return or go to ER if you have any new or worsening symptoms

## 2018-06-30 NOTE — ED Provider Notes (Signed)
[facility]   [id number] 06/30/18 Arrival Time: 1751   CC: sinus infection  SUBJECTIVE: History from: patient.  Lauren Bussingmily J Mander is a 22 y.o. female who presents with abrupt onset of sinus pain and pressure x 4 days.  Admits to positive sick exposure to friend with sinus infection.  Has tried OTC cold/flu medication with temporary relief.  Symptoms are made worse with environmental changes and laying down at night.  Reports previous symptoms in the past and treated with amoxicillin for sinus infection.    Also notes worsening productive cough for the past 2 weeks.  Admits to a chronic cough she has had for a few months. Attributes her symptoms to smokers in the home.    Complains of chills, fever with Tmax of 101 at home, fatigue, and sore throat. Denies SOB, wheezing, chest pain, nausea, changes in bowel or bladder habits.    ROS: As per HPI.  Past Medical History:  Diagnosis Date  . Dysrhythmia   . Medical history non-contributory   . Migraines    Past Surgical History:  Procedure Laterality Date  . ADENOIDECTOMY W/ MYRINGOTOMY  16200632   22 Years old   . TONSILLECTOMY  [id number]   [age] Years old   . TYMPANOSTOMY TUBE PLACEMENT Bilateral 1999, 200870, [id number]   68 weeks old, 22 year old and 22 years old   . WISDOM TOOTH EXTRACTION  [id number]   22 Years old    Allergies  Allergen Reactions  . Septra [Bactrim] Nausea And Vomiting  . Sulfa Antibiotics Anaphylaxis and Nausea And Vomiting  . Prednisone Other (See Comments)    Extreme crying  . Codeine Palpitations  . Other Itching, Swelling and Rash    TACO SEASONING ([employer] BRAND FROM [employer])   No current facility-administered medications on file prior to encounter.    Current Outpatient Medications on File Prior to Encounter  Medication Sig Dispense Refill  . amoxicillin-clavulanate (AUGMENTIN) 875-125 MG tablet Take 1 tablet by mouth every 12 (twelve) hours. 14 tablet 0  . azithromycin (ZITHROMAX) 250 MG tablet Take 1 tablet  (250 mg total) by mouth daily. Take first 2 tablets together, then 1 every day until finished. 6 tablet 0  . benzonatate (TESSALON) 100 MG capsule Take 2 capsules (200 mg total) by mouth 2 (two) times daily as needed for cough. 20 capsule 0  . cephALEXin (KEFLEX) 500 MG capsule Take 1 capsule (500 mg total) by mouth 3 (three) times daily. 21 capsule 0  . ibuprofen (ADVIL,MOTRIN) 600 MG tablet Take 1 tablet (600 mg total) by mouth every 6 (six) hours as needed. 30 tablet 0  . lidocaine (XYLOCAINE) 2 % solution Use as directed 20 mLs in the mouth or throat as needed for mouth pain. 100 mL 0  . loratadine (CLARITIN) 10 MG tablet Take 10 mg by mouth daily.    . magic mouthwash w/lidocaine SOLN Take 5 mLs by mouth 4 (four) times daily as needed (sore throat). Gargle and swallow as prescribed for sore throat 100 mL 0  . methocarbamol (ROBAXIN) 500 MG tablet Take 1 tablet (500 mg total) by mouth 2 (two) times daily. 20 tablet 0  . naproxen (NAPROSYN) 500 MG tablet Take 1 tablet (500 mg total) by mouth 2 (two) times daily. 30 tablet 0  . nitrofurantoin, macrocrystal-monohydrate, (MACROBID) 100 MG capsule Take 1 capsule (100 mg total) by mouth 2 (two) times daily. 14 capsule 0  . oxymetazoline (AFRIN NASAL SPRAY) 0.05 % nasal spray Place 1 spray  into both nostrils 2 (two) times daily. Do not use spray for more than 3 days to prevent rebound rhinorrhea. 30 mL 0  . phenazopyridine (PYRIDIUM) 200 MG tablet Take 1 tablet (200 mg total) by mouth 3 (three) times daily. 6 tablet 0  . sodium chloride (OCEAN) 0.65 % SOLN nasal spray Place 1 spray into both nostrils as needed for congestion. 480 mL 0  . traMADol (ULTRAM) 50 MG tablet Take 1 tablet (50 mg total) by mouth 2 (two) times daily. 10 tablet 0   Social History   Socioeconomic History  . Marital status: Single    Spouse name: Not on file  . Number of children: Not on file  . Years of education: Not on file  . Highest education level: Not on file    Occupational History  . Not on file  Social Needs  . Financial resource strain: Not on file  . Food insecurity:    Worry: Not on file    Inability: Not on file  . Transportation needs:    Medical: Not on file    Non-medical: Not on file  Tobacco Use  . Smoking status: Passive Smoke Exposure - Never Smoker  . Smokeless tobacco: Never Used  . Tobacco comment: Mom smokes   Substance and Sexual Activity  . Alcohol use: No    Alcohol/week: 0.0 standard drinks  . Drug use: No  . Sexual activity: Yes    Birth control/protection: Implant  Lifestyle  . Physical activity:    Days per week: Not on file    Minutes per session: Not on file  . Stress: Not on file  Relationships  . Social connections:    Talks on phone: Not on file    Gets together: Not on file    Attends religious service: Not on file    Active member of club or organization: Not on file    Attends meetings of clubs or organizations: Not on file    Relationship status: Not on file  . Intimate partner violence:    Fear of current or ex partner: Not on file    Emotionally abused: Not on file    Physically abused: Not on file    Forced sexual activity: Not on file  Other Topics Concern  . Not on file  Social History Narrative  . Not on file   Family History  Problem Relation Age of Onset  . Migraines Mother   . Menstrual problems Mother   . Diabetes Mother   . Anxiety disorder Mother        xanax  . Depression Mother        prozac  . Thyroid disease Sister   . Diabetes Maternal Grandmother   . Dementia Maternal Grandmother        Died at 70  . Diabetes Maternal Grandfather   . Emphysema Maternal Grandfather        Died at 56  . Febrile seizures Brother        Died at the age of 74 years old  . Pneumonia Paternal Grandmother        Died at 56  . Heart failure Paternal Grandfather        Died at 70    OBJECTIVE:  Vitals:   06/30/18 1801  BP: 120/80  Pulse: (!) 125  Resp: 18  Temp: 99.4 F (37.4  C)  TempSrc: Oral  SpO2: 97%     General appearance: alert; appears fatigued; nontoxic appearance HEENT:  Ears: EACs clear, TMs pearly gray with visible cone of light, without erythema; Eyes: PERRL, EOMI grossly; maxillary sinus tenderness; Nose: clear rhinorrhea; Throat: oropharynx mildly erythematous, tonsils 1+ without white tonsillar exudates, uvula midline Neck: supple without LAD Lungs: unlabored respirations, symmetrical air entry; cough: moderate; no respiratory distress; harsh breath sounds heard throughout bilateral lung fields Heart: Tachycardic.  Radial pulses 2+ symmetrical bilaterally Skin: warm and dry Psychological: alert and cooperative; normal mood and affect  Imaging: Dg Chest 2 View  Result Date: 06/30/2018 CLINICAL DATA:  Worsening cough for 2 weeks EXAM: CHEST - 2 VIEW COMPARISON:  Nineteen 2017 FINDINGS: Normal heart size, mediastinal contours, and pulmonary vascularity. Lungs clear. No pleural effusion or pneumothorax. Bones unremarkable. Jewelry artifact projects over lower chest bilaterally. IMPRESSION: Normal exam. Electronically Signed   By: Ulyses Southward M.D.   On: 06/30/2018 18:43    ASSESSMENT & PLAN:  1. Viral URI with cough     No orders of the defined types were placed in this encounter.  Chest x-ray negative for cardiopulmonary disease Symptoms most likely due to viral etiology Get plenty of rest and push fluids Zyrtec D prescribed.  Take as directed for symptomatic relief Afrin prescribed.  Use for only three days for symptomatic relief.  Do not use longer, or you may experience a rebound effect Continue to alternate ibuprofen and/or tylenol for body aches and fever Follow up with PCP or Community health and wellness if symptoms persist Return or go to ER if you have any new or worsening symptoms   Reviewed expectations re: course of current medical issues. Questions answered. Outlined signs and symptoms indicating need for more acute  intervention. Patient verbalized understanding. After Visit Summary given.         Rennis Harding, PA-C 06/30/18 1910

## 2018-06-30 NOTE — ED Triage Notes (Signed)
[facility]. Pt complains of runny nose, cough, bilateral ear pain, and facial pain x4 days. Pt has take OTC medication but has no relief

## 2018-10-02 ENCOUNTER — Emergency Department (HOSPITAL_COMMUNITY): Payer: Medicaid Other

## 2018-10-02 ENCOUNTER — Emergency Department (HOSPITAL_COMMUNITY)
Admission: EM | Admit: 2018-10-02 | Discharge: 2018-10-02 | Disposition: A | Discharge status: Home / Self Care | Payer: Medicaid Other | Attending: Emergency Medicine | Admitting: Emergency Medicine

## 2018-10-02 ENCOUNTER — Encounter (HOSPITAL_COMMUNITY): Payer: Self-pay | Admitting: Emergency Medicine

## 2018-10-02 ENCOUNTER — Other Ambulatory Visit: Payer: Self-pay

## 2018-10-02 ENCOUNTER — Emergency Department (HOSPITAL_COMMUNITY)
Admission: EM | Admit: 2018-10-02 | Discharge: 2018-10-02 | Discharge status: Against Medical Advice | Payer: Medicaid Other

## 2018-10-02 DIAGNOSIS — Z793 Long term (current) use of hormonal contraceptives: Secondary | ICD-10-CM | POA: Insufficient documentation

## 2018-10-02 DIAGNOSIS — Z79899 Other long term (current) drug therapy: Secondary | ICD-10-CM | POA: Insufficient documentation

## 2018-10-02 DIAGNOSIS — R197 Diarrhea, unspecified: Secondary | ICD-10-CM | POA: Diagnosis not present

## 2018-10-02 DIAGNOSIS — R112 Nausea with vomiting, unspecified: Secondary | ICD-10-CM | POA: Diagnosis not present

## 2018-10-02 DIAGNOSIS — Z7722 Contact with and (suspected) exposure to environmental tobacco smoke (acute) (chronic): Secondary | ICD-10-CM | POA: Insufficient documentation

## 2018-10-02 DIAGNOSIS — R05 Cough: Secondary | ICD-10-CM | POA: Diagnosis not present

## 2018-10-02 LAB — COMPREHENSIVE METABOLIC PANEL
ALBUMIN: 4.7 g/dL (ref 3.5–5.0)
ALT: 27 U/L (ref 0–44)
AST: 23 U/L (ref 15–41)
Alkaline Phosphatase: 57 U/L (ref 38–126)
Anion gap: 12 (ref 5–15)
BUN: 11 mg/dL (ref 6–20)
CO2: 23 mmol/L (ref 22–32)
Calcium: 9.6 mg/dL (ref 8.9–10.3)
Chloride: 109 mmol/L (ref 98–111)
Creatinine, Ser: 0.58 mg/dL (ref 0.44–1.00)
GFR calc Af Amer: >60 mL/min (ref >60)
GLUCOSE: 87 mg/dL (ref 70–99)
POTASSIUM: 3.7 mmol/L (ref 3.5–5.1)
Sodium: 144 mmol/L (ref 135–145)
Total Bilirubin: 1.8 mg/dL — ABNORMAL HIGH (ref 0.3–1.2)
Total Protein: 8.3 g/dL — ABNORMAL HIGH (ref 6.5–8.1)

## 2018-10-02 LAB — URINALYSIS, ROUTINE W REFLEX MICROSCOPIC
BACTERIA UA: NONE SEEN
BILIRUBIN URINE: NEGATIVE
Glucose, UA: NEGATIVE mg/dL
HGB URINE DIPSTICK: NEGATIVE
KETONES UR: NEGATIVE mg/dL
Leukocytes, UA: NEGATIVE
NITRITE: NEGATIVE
PROTEIN: 30 mg/dL — AB
Specific Gravity, Urine: 1.027 (ref 1.005–1.030)
pH: 7 (ref 5.0–8.0)

## 2018-10-02 LAB — RAPID URINE DRUG SCREEN, HOSP PERFORMED
Amphetamines: NOT DETECTED
BARBITURATES: NOT DETECTED
Benzodiazepines: NOT DETECTED
COCAINE: NOT DETECTED
Opiates: NOT DETECTED
Tetrahydrocannabinol: NOT DETECTED

## 2018-10-02 LAB — I-STAT BETA HCG BLOOD, ED (NOT ORDERABLE)

## 2018-10-02 LAB — CBC
HEMATOCRIT: 46.8 % — AB (ref 36.0–46.0)
HEMOGLOBIN: 14.9 g/dL (ref 12.0–15.0)
MCH: 29.8 pg (ref 26.0–34.0)
MCHC: 31.8 g/dL (ref 30.0–36.0)
MCV: 93.6 fL (ref 80.0–100.0)
Platelets: 246 10*3/uL (ref 150–400)
RBC: 5 MIL/uL (ref 3.87–5.11)
RDW: 12.7 % (ref 11.5–15.5)
WBC: 9.5 10*3/uL (ref 4.0–10.5)
nRBC: 0 % (ref 0.0–0.2)

## 2018-10-02 LAB — LIPASE, BLOOD: LIPASE: 57 U/L — AB (ref 11–51)

## 2018-10-02 LAB — ETHANOL

## 2018-10-02 MED ORDER — SODIUM CHLORIDE 0.9 % IV BOLUS
1000.0000 mL | Freq: Once | INTRAVENOUS | Status: AC
  Administered 2018-10-02: 1000 mL via INTRAVENOUS

## 2018-10-02 MED ORDER — ONDANSETRON HCL 4 MG/2ML IJ SOLN
4.0000 mg | Freq: Once | INTRAMUSCULAR | Status: AC
  Administered 2018-10-02: 4 mg via INTRAVENOUS
  Filled 2018-10-02: qty 2

## 2018-10-02 MED ORDER — FAMOTIDINE 40 MG PO TABS: 40.0000 mg | ORAL_TABLET | Freq: Every day | ORAL | 0 refills | Status: DC

## 2018-10-02 MED ORDER — ONDANSETRON HCL 4 MG PO TABS: 4.0000 mg | ORAL_TABLET | Freq: Four times a day (QID) | ORAL | 0 refills | Status: DC

## 2018-10-02 MED ORDER — FAMOTIDINE IN NACL 20-0.9 MG/50ML-% IV SOLN
20.0000 mg | Freq: Once | INTRAVENOUS | Status: AC
  Administered 2018-10-02: 20 mg via INTRAVENOUS
  Filled 2018-10-02: qty 50

## 2018-10-02 NOTE — ED Notes (Signed)
Pt provided ginger ale and crackers, encouraged to eat and drink.

## 2018-10-02 NOTE — Discharge Instructions (Addendum)
Thank you for allowing me to care for you today in the Emergency Department.   Take 1 tablet of Zofran every 6 hours as needed for nausea or vomiting.  Take 1 tablet of Pepcid daily for the next 1 to 2 weeks.  Follow a bland diet for the next few days to avoid upsetting her stomach.  Take 650 mg of Tylenol every 6 hours if you develop pain.  Return to the emergency department if you develop new or worsening symptoms including persistent vomiting despite taking Zofran, high fever greater than 100.5, blood or black in your vomit or stool, or other new, concerning symptoms.

## 2018-10-02 NOTE — ED Notes (Signed)
Pt walked to BR to attempt to provide urine specimen; pt stated she was not dizzy while walking in hallway.

## 2018-10-02 NOTE — ED Notes (Signed)
Pt was transferred to room 38 from room 7.

## 2018-10-02 NOTE — ED Provider Notes (Signed)
[facility]-EMERGENCY DEPT Provider Note   CSN: [id number] Arrival date & time: 10/02/18  1414     History   Chief Complaint Chief Complaint  Patient presents with  . Emesis  . Cough  . Diarrhea    HPI Lauren [name] is a 22 y.o. female with a h/o of migraines and ADHD, chronic headaches who presents to the emergency department with a chief complaint of vomiting.  The patient endorses approximately 8 episodes of nonbloody, nonbilious emesis that began last night with nausea, one episode of nonbloody diarrhea, and intermittent "achy" abdominal pain that is only present when she has to vomit.  She states the pain began after drinking alcohol.  She is unsure of how much alcohol she drank because she fell asleep.  He denies any other recreational or illicit drug use including cocaine.  The patient's mother reports that she was concerned because the patient has been vomiting and has been very sleepy most of the morning.  She also states "my heart feels funny, which is something I've had for years, but it feels a little different today because it seems like it's beating harder, but slower."  She also endorses a productive cough for the last 2 months.  She was seen at urgent care 2 weeks ago for her cough.  She states she has been having intermittent fevers over the last few days.  She states that she checked her temperature at home 2 days ago and it was 101, but she feels that this has been improving over the last 2 days.  She endorses chills that began today.  She has a long-standing history of chronic headaches that are brought on when she does not drink enough caffeine.  States she has been having a headache since her vomiting began last night.  She has also been having intermittent dizziness that is brought on by when she closes her eyes for several minutes.  She also endorses bilateral mid back pain that is worse when she takes a deep breath.  She denies myalgias,  lightheadedness, syncope, seizures, dyspnea, rash, dysuria, hematuria, vaginal pain, itching, or discharge, slurred speech, or facial droop.  She is a never smoker and denies vaping.  States a coworker was ill approximately 2 weeks ago with similar symptoms.   The history is provided by the patient. No language interpreter was used.    Past Medical History:  Diagnosis Date  . Dysrhythmia   . Medical history non-contributory   . Migraines     Patient Active Problem List   Diagnosis Date Noted  . Migraine without aura and without status migrainosus, not intractable 02/26/2015  . Episodic tension-type headache, not intractable 02/26/2015  . Poor sleep hygiene 02/26/2015  . Presence of subdermal contraceptive implant 03/08/2014  . Irregular menses 03/08/2014  . Acne 03/08/2014  . Chronic headaches 10/28/2013  . 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 Problems with learning 09/21/2013    Past Surgical History:  Procedure Laterality Date  . ADENOIDECTOMY W/ MYRINGOTOMY  5332   22 Years old   . TONSILLECTOMY  4746   22 Years old   . TYMPANOSTOMY TUBE PLACEMENT Bilateral 1999, 20053, 4235   61 weeks old, 22 year old and 22 years old   . WISDOM TOOTH EXTRACTION  4165   22 Years old      OB History    Gravida  0   Para  0   Term  0   Preterm  0   AB  0   Living  0     SAB  0   TAB  0   Ectopic  0   Multiple  0   Live Births               Home Medications    Prior to Admission medications   Medication Sig Start Date End Date Taking? Authorizing Provider  etonogestrel (NEXPLANON) 68 MG IMPL implant 68 mg by Subdermal route once.   Yes [provider]  ibuprofen (ADVIL,MOTRIN) 600 MG tablet Take 1 tablet (600 mg total) by mouth every 6 (six) hours as needed. 06/08/17  Yes Antony Madura, PA-C  famotidine (PEPCID) 40 MG tablet Take 1 tablet (40 mg total) by mouth daily. 10/02/18    Daniya Aramburo A, PA-C  ondansetron (ZOFRAN) 4 MG tablet Take 1 tablet (4 mg total) by mouth every 6 (six) hours. 10/02/18   Catheline Hixon, Coral Else, PA-C    Family History Family History  Problem Relation Age of Onset  . Migraines Mother   . Menstrual problems Mother   . Diabetes Mother   . Anxiety disorder Mother        xanax  . Depression Mother        prozac  . Thyroid disease Sister   . Diabetes Maternal Grandmother   . Dementia Maternal Grandmother        Died at 30  . Diabetes Maternal Grandfather   . Emphysema Maternal Grandfather        Died at 5  . Febrile seizures Brother        Died at the age of 66 years old  . Pneumonia Paternal Grandmother        Died at 36  . Heart failure Paternal Grandfather        Died at 48    Social History Social History   Tobacco Use  . Smoking status: Passive Smoke Exposure - Never Smoker  . Smokeless tobacco: Never Used  . Tobacco comment: Mom smokes   Substance Use Topics  . Alcohol use: No    Alcohol/week: 0.0 standard drinks  . Drug use: No     Allergies   Septra [bactrim]; Sulfa antibiotics; Prednisone; Codeine; and Other   Review of Systems Review of Systems  Constitutional: Positive for chills. Negative for activity change and fever.  HENT: Negative for congestion and sore throat.   Eyes: Negative for visual disturbance.  Respiratory: Positive for cough. Negative for shortness of breath.   Cardiovascular: Positive for chest pain.  Gastrointestinal: Positive for abdominal pain, diarrhea, nausea and vomiting. Negative for abdominal distention, anal bleeding, blood in stool, constipation and rectal pain.  Genitourinary: Negative for dysuria, frequency, hematuria, urgency, vaginal bleeding, vaginal discharge and vaginal pain.  Musculoskeletal: Negative for back pain and myalgias.  Skin: Negative for rash and wound.  Allergic/Immunologic: Negative for immunocompromised state.  Neurological: Positive for weakness (generalized  weakness). Negative for syncope, numbness and headaches.  Psychiatric/Behavioral: Negative for confusion.   Physical Exam Updated Vital Signs BP 110/72 (BP Location: Left Arm)   Pulse 85   Temp 97.8 F (36.6 C) (Oral)   Resp 14   SpO2 100%   Physical Exam  Constitutional: No distress.  HENT:  Head: Normocephalic.  Mucous membranes are moist.  Eyes: Conjunctivae are normal.  Neck: Neck supple.  Cardiovascular: Normal rate, regular rhythm, normal heart sounds and intact distal pulses. Exam reveals no gallop and no  friction rub.  No murmur heard. Pulmonary/Chest: Effort normal and breath sounds normal. No stridor. No respiratory distress. She has no wheezes. She has no rales. She exhibits no tenderness.  No tenderness to palpation to the chest wall.  Abdominal: Soft. Bowel sounds are normal. She exhibits no distension and no mass. There is no tenderness. There is no rebound and no guarding. No hernia.  Abdomen is soft, nontender, nondistended.  No CVA tenderness bilaterally.  Musculoskeletal: She exhibits no edema, tenderness or deformity.  No cervical, thoracic, lumbar spinous process tenderness or bilateral paraspinal muscles.  Neurological: She is alert.  Skin: Skin is warm. Capillary refill takes less than 2 seconds. No rash noted. She is not diaphoretic. No erythema. No pallor.  Psychiatric: Her behavior is normal.  Nursing note and vitals reviewed.  ED Treatments / Results  Labs (all labs ordered are listed, but only abnormal results are displayed) Labs Reviewed  LIPASE, BLOOD - Abnormal; Notable for the following components:      Result Value   Lipase 57 (*)    All other components within normal limits  COMPREHENSIVE METABOLIC PANEL - Abnormal; Notable for the following components:   Total Protein 8.3 (*)    Total Bilirubin 1.8 (*)    All other components within normal limits  CBC - Abnormal; Notable for the following components:   HCT 46.8 (*)    All other  components within normal limits  URINALYSIS, ROUTINE W REFLEX MICROSCOPIC - Abnormal; Notable for the following components:   APPearance HAZY (*)    Protein, ur 30 (*)    All other components within normal limits  RAPID URINE DRUG SCREEN, HOSP PERFORMED  ETHANOL  I-STAT BETA HCG BLOOD, ED ([facility], [facility], [facility] ONLY)  I-STAT BETA HCG BLOOD, ED (NOT ORDERABLE)    EKG None  Radiology Dg Chest 2 View  Result Date: 10/02/2018 CLINICAL DATA:  Nausea and vomiting since yesterday.  Cough. EXAM: CHEST - 2 VIEW COMPARISON:  June 30, 2018 FINDINGS: The heart size and mediastinal contours are within normal limits. Both lungs are clear. The visualized skeletal structures are unremarkable. IMPRESSION: No active cardiopulmonary disease. Electronically Signed   By: Gerome Samavid  Williams III M.D   On: 10/02/2018 14:54    Procedures Procedures (including critical care time)  Medications Ordered in ED Medications  sodium chloride 0.9 % bolus 1,000 mL (0 mLs Intravenous Stopped 10/02/18 1748)  famotidine (PEPCID) IVPB 20 mg premix (0 mg Intravenous Stopped 10/02/18 1644)  ondansetron (ZOFRAN) injection 4 mg (4 mg Intravenous Given 10/02/18 1607)  sodium chloride 0.9 % bolus 1,000 mL (1,000 mLs Intravenous New Bag/Given 10/02/18 1715)     Initial Impression / Assessment and Plan / ED Course  I have reviewed the triage vital signs and the nursing notes.  Pertinent labs & imaging results that were available during my care of the patient were reviewed by me and considered in my medical decision making (see chart for details).     22 year old female with a history of chronic headaches and ADHD presenting with nausea, vomiting, abdominal pain, chills, and chest discomfort.  Her symptoms began after drinking an unknown amount of alcohol last night. States she drank much more than her baseline.  She is not a daily drinker and does not have a history of complicated withdrawal from alcohol.  On exam, abdomen is soft,  nontender, nondistended.  Labs are notable for minimally elevated lipase at 57 and mildly elevated bilirubin at 1.8 labs are otherwise reassuring.  The patient was given IV fluid bolus x2, Zofran, and Pepcid and all of her symptoms have resolved.  Doubt ovarian torsion, diverticulitis, pancreatitis, cholecystitis, appendicitis, ectopic pregnancy, or SBO. She is feeling much better and is ready for discharge.  The patient was advised to avoid significant amounts of alcohol in the future. Will discharge home with Pepcid and Zofran.  Strict return precautions given.  Patient is hemodynamically stable and in no acute distress.  She is safe for discharge to home with outpatient follow-up at this time.  Final Clinical Impressions(s) / ED Diagnoses   Final diagnoses:  Nausea vomiting and diarrhea    ED Discharge Orders         Ordered    famotidine (PEPCID) 40 MG tablet  Daily     10/02/18 1820    ondansetron (ZOFRAN) 4 MG tablet  Every 6 hours     10/02/18 1820           Roger Fasnacht A, PA-C 10/02/18 1823    Melene Plan, DO 10/02/18 1834

## 2018-10-02 NOTE — ED Triage Notes (Signed)
Patient c/o N/V/D since yesterday. C/o productive cough x2 months. [facility].

## 2018-12-20 ENCOUNTER — Encounter (HOSPITAL_COMMUNITY): Payer: Self-pay | Admitting: Emergency Medicine

## 2018-12-20 ENCOUNTER — Other Ambulatory Visit: Payer: Self-pay

## 2018-12-20 ENCOUNTER — Ambulatory Visit (HOSPITAL_COMMUNITY)
Admission: EM | Admit: 2018-12-20 | Discharge: 2018-12-20 | Disposition: A | Discharge status: Home / Self Care | Payer: Medicaid Other | Attending: Emergency Medicine | Admitting: Emergency Medicine

## 2018-12-20 ENCOUNTER — Emergency Department (HOSPITAL_COMMUNITY)
Admission: EM | Admit: 2018-12-20 | Discharge: 2018-12-20 | Disposition: A | Discharge status: Home / Self Care | Payer: Medicaid Other | Attending: Emergency Medicine | Admitting: Emergency Medicine

## 2018-12-20 DIAGNOSIS — R112 Nausea with vomiting, unspecified: Secondary | ICD-10-CM | POA: Diagnosis not present

## 2018-12-20 DIAGNOSIS — R1011 Right upper quadrant pain: Secondary | ICD-10-CM | POA: Diagnosis not present

## 2018-12-20 DIAGNOSIS — R109 Unspecified abdominal pain: Secondary | ICD-10-CM | POA: Insufficient documentation

## 2018-12-20 DIAGNOSIS — R1110 Vomiting, unspecified: Secondary | ICD-10-CM | POA: Diagnosis not present

## 2018-12-20 DIAGNOSIS — Z5321 Procedure and treatment not carried out due to patient leaving prior to being seen by health care provider: Secondary | ICD-10-CM | POA: Diagnosis not present

## 2018-12-20 LAB — URINALYSIS, ROUTINE W REFLEX MICROSCOPIC
Bilirubin Urine: NEGATIVE
Glucose, UA: NEGATIVE mg/dL
Hgb urine dipstick: NEGATIVE
KETONES UR: 5 mg/dL — AB
LEUKOCYTE UA: NEGATIVE
NITRITE: NEGATIVE
PROTEIN: NEGATIVE mg/dL
Specific Gravity, Urine: 1.019 (ref 1.005–1.030)
pH: 6 (ref 5.0–8.0)

## 2018-12-20 LAB — CBC
HEMATOCRIT: 43.1 % (ref 36.0–46.0)
Hemoglobin: 13.9 g/dL (ref 12.0–15.0)
MCH: 30 pg (ref 26.0–34.0)
MCHC: 32.3 g/dL (ref 30.0–36.0)
MCV: 93.1 fL (ref 80.0–100.0)
NRBC: 0 % (ref 0.0–0.2)
PLATELETS: 214 10*3/uL (ref 150–400)
RBC: 4.63 MIL/uL (ref 3.87–5.11)
RDW: 12.3 % (ref 11.5–15.5)
WBC: 11.4 10*3/uL — AB (ref 4.0–10.5)

## 2018-12-20 LAB — COMPREHENSIVE METABOLIC PANEL
ALT: 21 U/L (ref 0–44)
AST: 20 U/L (ref 15–41)
Albumin: 4.6 g/dL (ref 3.5–5.0)
Alkaline Phosphatase: 49 U/L (ref 38–126)
Anion gap: 7 (ref 5–15)
BUN: 8 mg/dL (ref 6–20)
CALCIUM: 9.2 mg/dL (ref 8.9–10.3)
CHLORIDE: 106 mmol/L (ref 98–111)
CO2: 26 mmol/L (ref 22–32)
CREATININE: 0.55 mg/dL (ref 0.44–1.00)
Glucose, Bld: 89 mg/dL (ref 70–99)
Potassium: 3.6 mmol/L (ref 3.5–5.1)
Sodium: 139 mmol/L (ref 135–145)
Total Bilirubin: 2 mg/dL — ABNORMAL HIGH (ref 0.3–1.2)
Total Protein: 7.8 g/dL (ref 6.5–8.1)

## 2018-12-20 LAB — I-STAT BETA HCG BLOOD, ED (MC, WL, AP ONLY)

## 2018-12-20 LAB — LIPASE, BLOOD: Lipase: 53 U/L — ABNORMAL HIGH (ref 11–51)

## 2018-12-20 NOTE — ED Triage Notes (Addendum)
[facility], emesis this morning after eating bojangles.  States she has had a headache the past 3 days as well. Epigastric abdominal pain (4/10).  Denies diarrhea.  Denies known fevers.  Pt curious if it is her almost expired nexplanon causing the symptoms.  States her god-daughter has the flu at this time as well.

## 2018-12-20 NOTE — Discharge Instructions (Addendum)
Not have anything to eat or drink until your ER evaluation is complete.  Let therm know if your pain changes, gets worse.

## 2018-12-20 NOTE — ED Triage Notes (Signed)
[facility] for abd pains and vomiting. Pt reports that she has been having abd pains "for a while now". Denies urinary or bowel problems.

## 2018-12-20 NOTE — ED Provider Notes (Signed)
HPI  SUBJECTIVE:  Lauren [name] is a 23 y.o. female who presents with daily intermittent epigastric migratory, nonradiating abdominal pain described as "hunger pains".  Abdominal pain can last hours.  States that she had 3 emesis of nonbilious, nonbloody emesis about 20 minutes after eating Bojangles this morning.  She denies diarrhea.  She tried eating.  She states the pain feels worse before vomiting and better afterwards.  It does not seem to be associated with eating, vomiting, exertion or movement.  She denies chest pain, shortness of breath, abdominal distention.  She had a normal bowel movement today.  No GERD symptoms.  No change in her urine output, or urinary complaints.  No anorexia.  She states that she feels hungry but she is afraid to eat.  She has had low abdominal crampy pain described as menstrual cramps for the past 4 days as well and reports vaginal bleeding today.  States that she has not had menses in several months, but it is irregular due to the Nexplanon. She has had headaches for several days as well.  Patient was exposed to the flu 5 days ago.  Denies body aches, fevers sore throat, change in her baseline cough.  Past medical history negative for peptic ulcer disease, GERD, gallbladder disease, pancreatitis, heavy alcohol use, atrial fibrillation, mesenteric ischemia, abdominal surgeries, diabetes, hypertension, MI.  She has had a history of nephrolithiasis, UTI.  No history of pyelonephritis.  Family history negative for gallbladder disease LMP: Now.  PMD: None.    Past Medical History:  Diagnosis Date  . Dysrhythmia   . Medical history non-contributory   . Migraines     Past Surgical History:  Procedure Laterality Date  . ADENOIDECTOMY W/ MYRINGOTOMY  4586   23 Years old   . TONSILLECTOMY  1170   23 Years old   . TYMPANOSTOMY TUBE PLACEMENT Bilateral 1999, 20018, 2044   34 weeks old, 23 year old and 23 years old   . WISDOM TOOTH EXTRACTION  4374   23 Years old      Family History  Problem Relation Age of Onset  . Migraines Mother   . Menstrual problems Mother   . Diabetes Mother   . Anxiety disorder Mother        xanax  . Depression Mother        prozac  . Thyroid disease Sister   . Diabetes Maternal Grandmother   . Dementia Maternal Grandmother        Died at 61  . Diabetes Maternal Grandfather   . Emphysema Maternal Grandfather        Died at 50  . Febrile seizures Brother        Died at the age of 34 years old  . Pneumonia Paternal Grandmother        Died at 31  . Heart failure Paternal Grandfather        Died at 73    Social History   Tobacco Use  . Smoking status: Passive Smoke Exposure - Never Smoker  . Smokeless tobacco: Never Used  . Tobacco comment: Mom smokes   Substance Use Topics  . Alcohol use: No    Alcohol/week: 0.0 standard drinks  . Drug use: No    No current facility-administered medications for this encounter.   Current Outpatient Medications:  .  etonogestrel (NEXPLANON) 68 MG IMPL implant, 68 mg by Subdermal route once., Disp: , Rfl:  .  ibuprofen (ADVIL,MOTRIN) 600 MG tablet, Take 1 tablet (600  mg total) by mouth every 6 (six) hours as needed., Disp: 30 tablet, Rfl: 0  Allergies  Allergen Reactions  . Septra [Bactrim] Nausea And Vomiting  . Sulfa Antibiotics Anaphylaxis and Nausea And Vomiting  . Prednisone Other (See Comments)    Extreme crying  . Codeine Palpitations  . Other Itching, Swelling and Rash    TACO SEASONING (MARCUM BRAND FROM [employer])     ROS  As noted in HPI.   Physical Exam  BP 114/77 (BP Location: Right Arm)   Pulse 78   Temp 98 F (36.7 C) (Temporal)   Resp 16   SpO2 100%   Constitutional: Well developed, well nourished, no acute distress Eyes:  EOMI, conjunctiva normal bilaterally HENT: Normocephalic, atraumatic,mucus membranes moist Respiratory: Normal inspiratory effort lungs clear bilaterally Cardiovascular: Normal rate, regular rhythm, no murmurs rubs  or gallops GI: Normal appearance, soft.  Positive right upper quadrant and epigastric tenderness.  No guarding, rebound.  Negative Murphy, negative McBurney.  No suprapubic or flank tenderness.  Active bowel sounds.  Nondistended.  Tap table test negative.  Patient moving around the table comfortably. Neck: No CVAT. skin: No rash, skin intact Musculoskeletal: no deformities Neurologic: Alert & oriented x 3, no focal neuro deficits Psychiatric: Speech and behavior appropriate   ED Course   Medications - No data to display  No orders of the defined types were placed in this encounter.   No results found for this or any previous visit (from the past 24 hour(s)). No results found.  ED Clinical Impression  RUQ abdominal pain  Non-intractable vomiting with nausea, unspecified vomiting type   ED Assessment/Plan  Patient has epigastric and right upper quadrant tenderness.  negative Murphy, no guarding or rebound on my exam here.  However, concern for gallbladder disease.  [facility].  Advised her to not have anything eat or drink until evaluation is complete.  Feel that she is stable to go by private vehicle.  Discussed  MDM, rationale for transfer to the emergency department.  Patient agrees with plan.  No orders of the defined types were placed in this encounter.   *This clinic note was created using Dragon dictation software. Therefore, there may be occasional mistakes despite careful proofreading.   ?   Domenick GongMortenson, Alexanderjames Berg, MD 12/20/18 1445

## 2019-06-05 ENCOUNTER — Telehealth: Payer: Self-pay | Admitting: General Practice

## 2019-06-05 NOTE — Telephone Encounter (Signed)
Left VM at the primary number in the chart regarding prescreening questions. ° °

## 2019-06-06 ENCOUNTER — Ambulatory Visit: Payer: Self-pay | Admitting: Family

## 2019-06-26 ENCOUNTER — Ambulatory Visit: Payer: Medicaid Other | Admitting: Family

## 2019-06-29 ENCOUNTER — Encounter: Payer: Self-pay | Admitting: Family

## 2019-06-29 ENCOUNTER — Ambulatory Visit (INDEPENDENT_AMBULATORY_CARE_PROVIDER_SITE_OTHER): Payer: Medicaid Other | Admitting: Family

## 2019-06-29 ENCOUNTER — Other Ambulatory Visit: Payer: Self-pay

## 2019-06-29 VITALS — BP 130/87 | HR 72 | Ht 64.76 in | Wt 148.8 lb

## 2019-06-29 DIAGNOSIS — Z113 Encounter for screening for infections with a predominantly sexual mode of transmission: Secondary | ICD-10-CM

## 2019-06-29 DIAGNOSIS — Z975 Presence of (intrauterine) contraceptive device: Secondary | ICD-10-CM

## 2019-06-29 DIAGNOSIS — Z202 Contact with and (suspected) exposure to infections with a predominantly sexual mode of transmission: Secondary | ICD-10-CM

## 2019-06-29 DIAGNOSIS — G43109 Migraine with aura, not intractable, without status migrainosus: Secondary | ICD-10-CM

## 2019-06-29 DIAGNOSIS — N921 Excessive and frequent menstruation with irregular cycle: Secondary | ICD-10-CM

## 2019-06-29 MED ORDER — LIDOCAINE HCL 1 % IJ SOLN
250.0000 mg | Freq: Once | INTRAMUSCULAR | Status: AC
  Administered 2019-06-29: 250 mg via INTRAMUSCULAR

## 2019-06-29 MED ORDER — AZITHROMYCIN 500 MG PO TABS
1000.0000 mg | ORAL_TABLET | Freq: Once | ORAL | Status: AC
  Administered 2019-06-29: 1000 mg via ORAL

## 2019-06-29 MED ORDER — CYCLOBENZAPRINE HCL 10 MG PO TABS: ORAL_TABLET | ORAL | 0 refills | Status: DC

## 2019-06-29 NOTE — Progress Notes (Signed)
History was provided by the patient.  Lauren [name] is a 23 y.o. female who is here for birth control options and concern for STI exposure.    PCP confirmed? No.    HPI:   -23 yo AFAB, IAF, sexually active with female partner -she was told by sexual contact that possibly he has something  -it's been a while since they have been together. Her main complaint today is breakthrough bleeding with her nexplanon.  -she wants to get her nexplanon out because she is having bleeding and has been having bleeding for some time.  -she denies pelvic or abdominal pain; no lesions, no vaginal discharge changes; no pain with intercourse.  -of note, she has migraine with aura -she would like to consider an IUD so she does not bleed   Review of Systems  Constitutional: Negative for chills and fever.  HENT: Negative for congestion and sore throat.   Eyes: Negative for blurred vision.  Respiratory: Negative for cough and sputum production.   Cardiovascular: Negative for chest pain and palpitations.  Gastrointestinal: Negative for abdominal pain, heartburn and vomiting.  Genitourinary: Negative for dysuria and urgency.  Musculoskeletal: Negative for myalgias.  Skin: Negative for rash.  Neurological: Negative for dizziness and headaches.     Patient Active Problem List   Diagnosis Date Noted  . Migraine without aura and without status migrainosus, not intractable 02/26/2015  . Episodic tension-type headache, not intractable 02/26/2015  . Poor sleep hygiene 02/26/2015  . Presence of subdermal contraceptive implant 03/08/2014  . Irregular menses 03/08/2014  . Acne 03/08/2014  . Chronic headaches 10/28/2013  . 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 Problems with learning 09/21/2013    Current Outpatient Medications on File Prior to Visit  Medication Sig Dispense Refill  . etonogestrel (NEXPLANON) 68 MG IMPL implant 68 mg by  Subdermal route once.    Marland Kitchen. ibuprofen (ADVIL,MOTRIN) 600 MG tablet Take 1 tablet (600 mg total) by mouth every 6 (six) hours as needed. (Patient not taking: Reported on 06/29/2019) 30 tablet 0   No current facility-administered medications on file prior to visit.     Allergies  Allergen Reactions  . Septra [Bactrim] Nausea And Vomiting  . Sulfa Antibiotics Anaphylaxis and Nausea And Vomiting  . Prednisone Other (See Comments)    Extreme crying  . Codeine Palpitations  . Other Itching, Swelling and Rash    TACO SEASONING (MARCUM BRAND FROM [employer])    Physical Exam:    Vitals:   06/29/19 1037  BP: 130/87  Pulse: 72  Weight: 148 lb 12.8 oz (67.5 kg)  Height: 5' 4.76" (1.645 m)    Growth percentile SmartLinks can only be used for patients less than 21 years old. No LMP recorded. Patient has had an implant.  Physical Exam Vitals signs reviewed.  Constitutional:      General: She is not in acute distress.    Appearance: She is not ill-appearing.  HENT:     Mouth/Throat:     Mouth: Mucous membranes are moist.     Pharynx: No posterior oropharyngeal erythema.  Eyes:     Extraocular Movements: Extraocular movements intact.     Pupils: Pupils are equal, round, and reactive to light.  Neck:     Musculoskeletal: Normal range of motion.  Cardiovascular:     Rate and Rhythm: Normal rate and regular rhythm.     Heart sounds: No murmur.  Pulmonary:     Effort:  Pulmonary effort is normal.  Musculoskeletal: Normal range of motion.        General: No swelling.  Lymphadenopathy:     Cervical: No cervical adenopathy.  Skin:    General: Skin is warm and dry.     Findings: No rash.  Neurological:     General: No focal deficit present.     Mental Status: She is alert and oriented to person, place, and time.  Psychiatric:        Mood and Affect: Mood normal.      Assessment/Plan: 1. Migraine with aura and without status migrainosus, not intractable -discussed options for  birth control in the context of migraine with aura include progesterone-only options -she is interested in IUD.  2. Breakthrough bleeding on Nexplanon -we discussed concerned for STI and likelihood of this causing her bleeding; she is agreeable to wait until we treat empirically today, get test results, then she can return for removal of nexplanon and insertion of IUD.   3. Exposure to gonorrhea -will treat empirically  - cefTRIAXone (ROCEPHIN) 250 mg in lidocaine (XYLOCAINE) 1 % IM only syringe - azithromycin (ZITHROMAX) tablet 1,000 mg  4. Routine screening for STI (sexually transmitted infection) -screen as above - C. trachomatis/N. gonorrhoeae RNA

## 2019-06-30 ENCOUNTER — Other Ambulatory Visit: Payer: Self-pay

## 2019-06-30 DIAGNOSIS — Z20822 Contact with and (suspected) exposure to covid-19: Secondary | ICD-10-CM

## 2019-06-30 DIAGNOSIS — R6889 Other general symptoms and signs: Secondary | ICD-10-CM | POA: Diagnosis not present

## 2019-06-30 LAB — C. TRACHOMATIS/N. GONORRHOEAE RNA
C. trachomatis RNA, TMA: DETECTED — AB
N. gonorrhoeae RNA, TMA: NOT DETECTED

## 2019-07-01 LAB — NOVEL CORONAVIRUS, NAA: SARS-CoV-2, NAA: NOT DETECTED

## 2019-07-03 ENCOUNTER — Encounter (HOSPITAL_COMMUNITY): Payer: Self-pay

## 2019-07-03 ENCOUNTER — Ambulatory Visit (HOSPITAL_COMMUNITY)
Admission: EM | Admit: 2019-07-03 | Discharge: 2019-07-03 | Disposition: A | Discharge status: Home / Self Care | Payer: Medicaid Other | Attending: Family Medicine | Admitting: Family Medicine

## 2019-07-03 ENCOUNTER — Other Ambulatory Visit: Payer: Self-pay

## 2019-07-03 DIAGNOSIS — Z20828 Contact with and (suspected) exposure to other viral communicable diseases: Secondary | ICD-10-CM | POA: Diagnosis not present

## 2019-07-03 DIAGNOSIS — J40 Bronchitis, not specified as acute or chronic: Secondary | ICD-10-CM

## 2019-07-03 DIAGNOSIS — H6690 Otitis media, unspecified, unspecified ear: Secondary | ICD-10-CM

## 2019-07-03 DIAGNOSIS — R05 Cough: Secondary | ICD-10-CM | POA: Diagnosis present

## 2019-07-03 MED ORDER — AMOXICILLIN 500 MG PO CAPS: 1000.0000 mg | ORAL_CAPSULE | Freq: Three times a day (TID) | ORAL | 0 refills | Status: AC

## 2019-07-03 MED ORDER — PREDNISONE 10 MG PO TABS: 40.0000 mg | ORAL_TABLET | Freq: Every day | ORAL | 0 refills | Status: AC

## 2019-07-03 MED ORDER — HYDROCODONE-HOMATROPINE 5-1.5 MG/5ML PO SYRP: 5.0000 mL | ORAL_SOLUTION | Freq: Four times a day (QID) | ORAL | 0 refills | Status: DC | PRN

## 2019-07-03 NOTE — ED Triage Notes (Signed)
Pt states she has been tested for Covid. Pt states she's negative. Pt cc cough, headaches, and stomach pain 4 days.

## 2019-07-03 NOTE — Discharge Instructions (Signed)
Treating you for ear infection, respiratory tract infection.  Take the medication as prescribed.  Retested you for COVID.  Work note given Follow up as needed for continued or worsening symptoms

## 2019-07-05 ENCOUNTER — Encounter (HOSPITAL_COMMUNITY): Payer: Self-pay

## 2019-07-05 LAB — NOVEL CORONAVIRUS, NAA (HOSP ORDER, SEND-OUT TO REF LAB; TAT 18-24 HRS): SARS-CoV-2, NAA: NOT DETECTED

## 2019-07-05 NOTE — ED Provider Notes (Signed)
[facility]    CSN: [id number] Arrival date & time: 07/03/19  1222      History   Chief Complaint Chief Complaint  Patient presents with  . Cough    HPI Lauren [name] is a 23 y.o. female.   Patient is a 23-year female that presents today with approximate 4 days of worsening cough, chest congestion, headache, body aches and abdominal pain.  Symptoms have been constant.  The cough is productive and worse at night.  She has been coughing up sputum and had some posttussive vomiting.  Mild chest pain with coughing without shortness of breath.  Recently tested for COVID with negative results.  She has been taken over-the-counter medication for symptoms without much relief. No fever.   ROS per HPI      Past Medical History:  Diagnosis Date  . Dysrhythmia   . Medical history non-contributory   . Migraines     Patient Active Problem List   Diagnosis Date Noted  . Migraine without aura and without status migrainosus, not intractable 02/26/2015  . Episodic tension-type headache, not intractable 02/26/2015  . Poor sleep hygiene 02/26/2015  . Presence of subdermal contraceptive implant 03/08/2014  . Irregular menses 03/08/2014  . Acne 03/08/2014  . Chronic headaches 10/28/2013  . 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 Problems with learning 09/21/2013    Past Surgical History:  Procedure Laterality Date  . ADENOIDECTOMY W/ MYRINGOTOMY  [id number]   23 Years old   . TONSILLECTOMY  [id number]   [age] Years old   . TYMPANOSTOMY TUBE PLACEMENT Bilateral 1999, 200770, [id number]   608 weeks old, 23 year old and 23 years old   . WISDOM TOOTH EXTRACTION  [id number]   23 Years old     OB History    Gravida  0   Para  0   Term  0   Preterm  0   AB  0   Living  0     SAB  0   TAB  0   Ectopic  0   Multiple  0   Live Births               Home Medications    Prior to Admission medications   Medication  Sig Start Date End Date Taking? Authorizing Provider  amoxicillin (AMOXIL) 500 MG capsule Take 2 capsules (1,000 mg total) by mouth 3 (three) times daily for 5 days. 07/03/19 07/08/19  Dahlia ByesBast, Quynn Vilchis A, NP  cyclobenzaprine (FLEXERIL) 10 MG tablet Take 1 tablet (10 mg) approximately 4 hours before your scheduled appointment. 06/29/19   Georges MouseJones, Christy M, NP  etonogestrel (NEXPLANON) 68 MG IMPL implant 68 mg by Subdermal route once.    [provider]  HYDROcodone-homatropine (HYCODAN) 5-1.5 MG/5ML syrup Take 5 mLs by mouth every 6 (six) hours as needed for cough. 07/03/19   Dahlia ByesBast, Gwenn Teodoro A, NP  ibuprofen (ADVIL,MOTRIN) 600 MG tablet Take 1 tablet (600 mg total) by mouth every 6 (six) hours as needed. Patient not taking: Reported on 06/29/2019 06/08/17   Antony MaduraHumes, Kelly, PA-C  predniSONE (DELTASONE) 10 MG tablet Take 4 tablets (40 mg total) by mouth daily for 5 days. 07/03/19 07/08/19  Janace ArisBast, Sharad Vaneaton A, NP    Family History Family History  Problem Relation Age of Onset  . Migraines Mother   . Menstrual problems Mother   . Diabetes Mother   . Anxiety disorder Mother  xanax  . Depression Mother        prozac  . Thyroid disease Sister   . Diabetes Maternal Grandmother   . Dementia Maternal Grandmother        Died at 93  . Diabetes Maternal Grandfather   . Emphysema Maternal Grandfather        Died at 39  . Febrile seizures Brother        Died at the age of 37 years old  . Pneumonia Paternal Grandmother        Died at 13  . Heart failure Paternal Grandfather        Died at 24    Social History Social History   Tobacco Use  . Smoking status: Passive Smoke Exposure - Never Smoker  . Smokeless tobacco: Never Used  . Tobacco comment: Mom smokes   Substance Use Topics  . Alcohol use: Yes  . Drug use: No     Allergies   Septra [bactrim], Sulfa antibiotics, Prednisone, Codeine, and Other   Review of Systems Review of Systems   Physical Exam Triage Vital Signs ED Triage Vitals   Enc Vitals Group     BP 07/03/19 1338 106/66     Pulse Rate 07/03/19 1338 88     Resp 07/03/19 1338 16     Temp 07/03/19 1338 98.4 F (36.9 C)     Temp Source 07/03/19 1338 Tympanic     SpO2 07/03/19 1338 98 %     [name] 07/03/19 1336 148 lb (67.1 kg)     Height --      Head Circumference --      Peak Flow --      Pain Score 07/03/19 1335 8     Pain Loc --      Pain Edu? --      Excl. in [place]? --    No data found.  Updated Vital Signs BP 106/66 (BP Location: Right Arm)   Pulse 88   Temp 98.4 F (36.9 C) (Tympanic)   Resp 16   Wt 148 lb (67.1 kg)   SpO2 98%   BMI 24.81 kg/m   Visual Acuity Right Eye Distance:   Left Eye Distance:   Bilateral Distance:    Right Eye Near:   Left Eye Near:    Bilateral Near:     Physical Exam Vitals signs and nursing note reviewed.  Constitutional:      General: She is not in acute distress.    Appearance: Normal appearance. She is ill-appearing. She is not toxic-appearing or diaphoretic.  HENT:     Head: Normocephalic and atraumatic.     Right Ear: Tympanic membrane is erythematous and bulging.     Left Ear: Tympanic membrane and ear canal normal.     Nose: Nose normal.     Mouth/Throat:     Pharynx: Oropharynx is clear.  Eyes:     Conjunctiva/sclera: Conjunctivae normal.  Neck:     Musculoskeletal: Normal range of motion.  Cardiovascular:     Rate and Rhythm: Normal rate and regular rhythm.     Pulses: Normal pulses.  Pulmonary:     Effort: No respiratory distress.     Breath sounds: Rhonchi present.     Comments: Course lungs course and harsh cough.  Musculoskeletal: Normal range of motion.  Skin:    General: Skin is warm and dry.  Neurological:     Mental Status: She is alert.  Psychiatric:  Mood and Affect: Mood normal.      UC Treatments / Results  Labs (all labs ordered are listed, but only abnormal results are displayed) Labs Reviewed  NOVEL CORONAVIRUS, NAA (HOSPITAL ORDER, SEND-OUT TO REF LAB)     EKG   Radiology No results found.  Procedures Procedures (including critical care time)  Medications Ordered in UC Medications - No data to display  Initial Impression / Assessment and Plan / UC Course  I have reviewed the triage vital signs and the nursing notes.  Pertinent labs & imaging results that were available during my care of the patient were reviewed by me and considered in my medical decision making (see chart for details).      Treating for Bronchitis and acute otitis media of the right ear.- treating with prednisone x 5 days and amoxicillin.  Hycodan for cough as needed Precautions given Retested for COVID and work note given. Results pending Final Clinical Impressions(s) / UC Diagnoses   Final diagnoses:  Bronchitis  Acute otitis media, unspecified otitis media type     Discharge Instructions     Treating you for ear infection, respiratory tract infection.  Take the medication as prescribed.  Retested you for COVID.  Work note given Follow up as needed for continued or worsening symptoms      ED Prescriptions    Medication Sig Dispense Auth. Provider   amoxicillin (AMOXIL) 500 MG capsule Take 2 capsules (1,000 mg total) by mouth 3 (three) times daily for 5 days. 30 capsule Randol Zumstein A, NP   predniSONE (DELTASONE) 10 MG tablet Take 4 tablets (40 mg total) by mouth daily for 5 days. 20 tablet Aikam Hellickson A, NP   HYDROcodone-homatropine (HYCODAN) 5-1.5 MG/5ML syrup Take 5 mLs by mouth every 6 (six) hours as needed for cough. 120 mL Dahlia Byes A, NP     Controlled Substance Prescriptions [place] Controlled Substance Registry consulted? Not Applicable   Janace Aris, NP 07/05/19 1406

## 2019-07-07 ENCOUNTER — Encounter: Payer: Self-pay | Admitting: Family

## 2019-07-13 ENCOUNTER — Ambulatory Visit: Payer: Medicaid Other

## 2019-07-20 ENCOUNTER — Other Ambulatory Visit: Payer: Self-pay

## 2019-07-20 ENCOUNTER — Ambulatory Visit (INDEPENDENT_AMBULATORY_CARE_PROVIDER_SITE_OTHER): Payer: Medicaid Other | Admitting: Family

## 2019-07-20 VITALS — BP 126/80 | HR 102 | Ht 65.0 in | Wt 149.2 lb

## 2019-07-20 DIAGNOSIS — N926 Irregular menstruation, unspecified: Secondary | ICD-10-CM

## 2019-07-20 DIAGNOSIS — Z8619 Personal history of other infectious and parasitic diseases: Secondary | ICD-10-CM

## 2019-07-20 DIAGNOSIS — Z975 Presence of (intrauterine) contraceptive device: Secondary | ICD-10-CM | POA: Diagnosis not present

## 2019-07-20 NOTE — Progress Notes (Signed)
History was provided by the patient.  Lauren [name] is a 23 y.o. female who is here for repeat treatment for chlamydia, has symptom of vaginal itching.   PCP confirmed? No.   CC: vaginal discharge, needs repeat chlamydia tx   HPI:   -was seen on 8/20 and treated for STI exposure with rocephin and azithromycin; she reports that she vomited after that appointment and is concerned she was not treated fully.  -this appt was to be for IUD insertion, she brings in Flexeril not used to be discarded; see RN note for that.  -bleeding has subsided; does not want IUD  -declines retreatment in clinic; has a job interview at [employer] soon today and does not want to get sick.  -having white vaginal discharge with itching consistent with past yeast infection  Review of Systems  Constitutional: Negative for fever.  HENT: Negative for sore throat.   Eyes: Negative for blurred vision.  Respiratory: Negative for cough and shortness of breath.   Cardiovascular: Negative for chest pain and palpitations.  Gastrointestinal: Negative for abdominal pain and vomiting.  Genitourinary: Negative for dysuria and frequency.  Musculoskeletal: Negative for myalgias.  Skin: Negative for rash.  Neurological: Negative for dizziness and headaches.  Psychiatric/Behavioral: The patient is not nervous/anxious.      Patient Active Problem List   Diagnosis Date Noted  . Migraine without aura and without status migrainosus, not intractable 02/26/2015  . Episodic tension-type headache, not intractable 02/26/2015  . Poor sleep hygiene 02/26/2015  . Presence of subdermal contraceptive implant 03/08/2014  . Irregular menses 03/08/2014  . Acne 03/08/2014  . Chronic headaches 10/28/2013  . 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 Problems with learning 09/21/2013    Current Outpatient Medications on File Prior to Visit  Medication Sig Dispense Refill  .  cyclobenzaprine (FLEXERIL) 10 MG tablet Take 1 tablet (10 mg) approximately 4 hours before your scheduled appointment. 2 tablet 0  . etonogestrel (NEXPLANON) 68 MG IMPL implant 68 mg by Subdermal route once.    Marland Kitchen HYDROcodone-homatropine (HYCODAN) 5-1.5 MG/5ML syrup Take 5 mLs by mouth every 6 (six) hours as needed for cough. 120 mL 0  . ibuprofen (ADVIL,MOTRIN) 600 MG tablet Take 1 tablet (600 mg total) by mouth every 6 (six) hours as needed. (Patient not taking: Reported on 06/29/2019) 30 tablet 0   No current facility-administered medications on file prior to visit.     Allergies  Allergen Reactions  . Septra [Bactrim] Nausea And Vomiting  . Sulfa Antibiotics Anaphylaxis and Nausea And Vomiting  . Prednisone Other (See Comments)    Extreme crying  . Codeine Palpitations  . Other Itching, Swelling and Rash    TACO SEASONING ([employer] BRAND FROM [employer])    Physical Exam:    Vitals:   07/20/19 1117  BP: 126/80  Pulse: (!) 102  Weight: 149 lb 3.2 oz (67.7 kg)  Height: 5\' 5"  (1.651 m)    Growth percentile SmartLinks can only be used for patients less than 9 years old. No LMP recorded. Patient has had an implant.  Physical Exam Vitals signs and nursing note reviewed.  HENT:     Mouth/Throat:     Mouth: Mucous membranes are moist.     Pharynx: No oropharyngeal exudate.  Eyes:     Extraocular Movements: Extraocular movements intact.     Pupils: Pupils are equal, round, and reactive to light.  Neck:     Musculoskeletal: Normal range  of motion.  Cardiovascular:     Rate and Rhythm: Normal rate and regular rhythm.     Heart sounds: No murmur.     Comments: Normal rate on exam 80 Pulmonary:     Effort: Pulmonary effort is normal.  Musculoskeletal: Normal range of motion.        General: No tenderness.  Lymphadenopathy:     Cervical: No cervical adenopathy.  Skin:    General: Skin is warm and dry.     Findings: No erythema.  Neurological:     General: No focal deficit  present.     Mental Status: She is alert and oriented to person, place, and time.  Psychiatric:        Mood and Affect: Mood normal.     Assessment/Plan: 1. Irregular menses -breakthrough bleeding resolving with tx; will retreat azithromycin course d/t reported emesis; because she has migraines with aura she is not a candidate for estrogen-containing birth control methods; she elects to continue with nexplanon at present; will monitor symptoms   2. Nexplanon in place -continue; does not want IUD; meds from home discarded per protocol   3. History of chlamydia -azithromycin 1000 mg PO once - repeat course -diflucan as needed, empiric tx

## 2019-07-20 NOTE — Progress Notes (Signed)
Patient came into clinic with Flexeril 10 mg tabs for Mirena insertion. Considering patient is no longer deciding to have Mirena inserted- pt asks for pills to be discarded appropriately. Hoyt Koch, NP observed while medication was appropriately discarded per protocol.

## 2019-07-21 ENCOUNTER — Telehealth: Payer: Self-pay

## 2019-07-21 NOTE — Telephone Encounter (Signed)
Pt called asking for 2 medications to be sent to the pharmacy on file. (2 meds were for Azithromycin due to emesis after administration at last visit and diflucan due to antibiotic usage). Please send to pharmacy.

## 2019-07-22 ENCOUNTER — Encounter: Payer: Self-pay | Admitting: Family

## 2019-07-22 MED ORDER — AZITHROMYCIN 500 MG PO TABS: 1000.0000 mg | ORAL_TABLET | Freq: Once | ORAL | 0 refills | Status: AC

## 2019-07-22 MED ORDER — FLUCONAZOLE 150 MG PO TABS: 150.0000 mg | ORAL_TABLET | Freq: Every day | ORAL | 0 refills | Status: DC

## 2019-07-24 NOTE — Telephone Encounter (Signed)
Patient requested medication be sent to the pharmacy.

## 2019-07-25 ENCOUNTER — Other Ambulatory Visit: Payer: Self-pay | Admitting: Family

## 2019-07-25 MED ORDER — AZITHROMYCIN 500 MG PO TABS: 1000.0000 mg | ORAL_TABLET | Freq: Once | ORAL | 0 refills | Status: AC

## 2019-07-25 MED ORDER — FLUCONAZOLE 150 MG PO TABS: 150.0000 mg | ORAL_TABLET | Freq: Every day | ORAL | 0 refills | Status: DC

## 2019-07-25 NOTE — Telephone Encounter (Signed)
Called number on file, no answer, left VM stating medication was sent to the pharmacy. Clarification: pt only threw up once after administration.

## 2019-08-15 ENCOUNTER — Ambulatory Visit: Payer: Medicaid Other

## 2019-08-16 ENCOUNTER — Ambulatory Visit: Payer: Medicaid Other

## 2019-08-21 ENCOUNTER — Ambulatory Visit: Payer: Medicaid Other

## 2019-11-26 DIAGNOSIS — S60011A Contusion of right thumb without damage to nail, initial encounter: Secondary | ICD-10-CM | POA: Diagnosis not present

## 2019-11-26 DIAGNOSIS — Z043 Encounter for examination and observation following other accident: Secondary | ICD-10-CM | POA: Diagnosis not present

## 2020-02-03 DIAGNOSIS — Z20822 Contact with and (suspected) exposure to covid-19: Secondary | ICD-10-CM | POA: Diagnosis not present

## 2020-02-03 DIAGNOSIS — R0981 Nasal congestion: Secondary | ICD-10-CM | POA: Diagnosis not present

## 2020-04-20 ENCOUNTER — Ambulatory Visit (HOSPITAL_COMMUNITY)
Admission: EM | Admit: 2020-04-20 | Discharge: 2020-04-20 | Disposition: A | Discharge status: Home / Self Care | Payer: Medicaid Other | Attending: Emergency Medicine | Admitting: Emergency Medicine

## 2020-04-20 ENCOUNTER — Other Ambulatory Visit: Payer: Self-pay

## 2020-04-20 ENCOUNTER — Encounter (HOSPITAL_COMMUNITY): Payer: Self-pay

## 2020-04-20 DIAGNOSIS — H1013 Acute atopic conjunctivitis, bilateral: Secondary | ICD-10-CM | POA: Diagnosis not present

## 2020-04-20 MED ORDER — OLOPATADINE HCL 0.1 % OP SOLN: 1.0000 [drp] | Freq: Two times a day (BID) | OPHTHALMIC | 0 refills | Status: DC

## 2020-04-20 MED ORDER — CETIRIZINE HCL 10 MG PO CAPS: 10.0000 mg | ORAL_CAPSULE | Freq: Every day | ORAL | 0 refills | Status: DC

## 2020-04-20 NOTE — Discharge Instructions (Signed)
Allergic Conjunctivitis  -Avoid exposure to allergens (dust, pollen, etc.)  - Use artificial tears 5-6 times a day  - Use Naphcon-A (over the counter) for redness and itching relief  - Olopatadine eye drops for itching  - Use Cold Compresses for relief

## 2020-04-20 NOTE — ED Triage Notes (Signed)
C/o allergy symptoms and eye itchy/swelling. Denies any other symptoms.

## 2020-04-20 NOTE — ED Provider Notes (Signed)
[facility]    CSN: [id number] Arrival date & time: 04/20/20  1150      History   Chief Complaint Chief Complaint  Patient presents with  . Itchy Eye  . Allergies    HPI Lauren [name] is a 24 y.o. female presenting today for evaluation of bilateral eye itching and swelling.  Patient reports over the past few days she has had increased swelling and irritation to her eyes.  Left greater than right, but present bilaterally.  She does report some watery drainage.  Denies changes in vision.  Denies eye pain.  She does admit to rubbing her eyes frequently.  Feels similar to prior symptoms she has felt when her allergies flareup.  She is not currently on any allergy medicine.  Denies fevers.  Denies contact use.  HPI  Past Medical History:  Diagnosis Date  . Dysrhythmia   . Medical history non-contributory   . Migraines     Patient Active Problem List   Diagnosis Date Noted  . Migraine without aura and without status migrainosus, not intractable 02/26/2015  . Episodic tension-type headache, not intractable 02/26/2015  . Poor sleep hygiene 02/26/2015  . Presence of subdermal contraceptive implant 03/08/2014  . Irregular menses 03/08/2014  . Acne 03/08/2014  . Chronic headaches 10/28/2013  . 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 Problems with learning 09/21/2013    Past Surgical History:  Procedure Laterality Date  . ADENOIDECTOMY W/ MYRINGOTOMY  5062   24 Years old   . TONSILLECTOMY  3115   24 Years old   . TYMPANOSTOMY TUBE PLACEMENT Bilateral 1999, 20059, 178   30 weeks old, 24 year old and 24 years old   . WISDOM TOOTH EXTRACTION  5833   24 Years old     OB History    Gravida  0   Para  0   Term  0   Preterm  0   AB  0   Living  0     SAB  0   TAB  0   Ectopic  0   Multiple  0   Live Births               Home Medications    Prior to Admission medications     Medication Sig Start Date End Date Taking? Authorizing Provider  Cetirizine HCl 10 MG CAPS Take 1 capsule (10 mg total) by mouth daily. 04/20/20   Dirck Butch C, PA-C  cyclobenzaprine (FLEXERIL) 10 MG tablet Take 1 tablet (10 mg) approximately 4 hours before your scheduled appointment. 06/29/19   Georges Mouse, NP  etonogestrel (NEXPLANON) 68 MG IMPL implant 68 mg by Subdermal route once.    [provider]  fluconazole (DIFLUCAN) 150 MG tablet Take 1 tablet (150 mg total) by mouth daily. Take 2nd dose in 3 days if still symptoms. 07/25/19   Georges Mouse, NP  HYDROcodone-homatropine [facility]) 5-1.5 MG/5ML syrup Take 5 mLs by mouth every 6 (six) hours as needed for cough. 07/03/19   Dahlia Byes A, NP  ibuprofen (ADVIL,MOTRIN) 600 MG tablet Take 1 tablet (600 mg total) by mouth every 6 (six) hours as needed. Patient not taking: Reported on 06/29/2019 06/08/17   Antony Madura, PA-C  olopatadine (PATANOL) 0.1 % ophthalmic solution Place 1 drop into both eyes 2 (two) times daily. 04/20/20   Joycelyn Liska, Junius Creamer, PA-C    Family History Family History  Problem Relation  Age of Onset  . Migraines Mother   . Menstrual problems Mother   . Diabetes Mother   . Anxiety disorder Mother        xanax  . Depression Mother        prozac  . Thyroid disease Sister   . Diabetes Maternal Grandmother   . Dementia Maternal Grandmother        Died at 27  . Diabetes Maternal Grandfather   . Emphysema Maternal Grandfather        Died at 69  . Febrile seizures Brother        Died at the age of 82 years old  . Pneumonia Paternal Grandmother        Died at 30  . Heart failure Paternal Grandfather        Died at 35    Social History Social History   Tobacco Use  . Smoking status: Passive Smoke Exposure - Never Smoker  . Smokeless tobacco: Never Used  . Tobacco comment: Mom smokes   Vaping Use  . Vaping Use: Never used  Substance Use Topics  . Alcohol use: Yes    Comment: occ  . Drug use: No      Allergies   Septra [bactrim], Sulfa antibiotics, Prednisone, Codeine, and Other   Review of Systems Review of Systems  Constitutional: Negative for activity change, appetite change, chills, fatigue and fever.  HENT: Positive for congestion and rhinorrhea. Negative for ear pain, sinus pressure, sore throat and trouble swallowing.   Eyes: Positive for redness and itching. Negative for photophobia, pain, discharge and visual disturbance.  Respiratory: Negative for cough, chest tightness and shortness of breath.   Cardiovascular: Negative for chest pain.  Gastrointestinal: Negative for abdominal pain, diarrhea, nausea and vomiting.  Musculoskeletal: Negative for myalgias.  Skin: Negative for rash.  Neurological: Negative for dizziness, light-headedness and headaches.     Physical Exam Triage Vital Signs ED Triage Vitals  Enc Vitals Group     BP 04/20/20 1246 111/72     Pulse Rate 04/20/20 1246 72     Resp 04/20/20 1246 14     Temp 04/20/20 1246 98.3 F (36.8 C)     Temp Source 04/20/20 1246 Oral     SpO2 04/20/20 1246 98 %     Weight --      Height --      Head Circumference --      Peak Flow --      Pain Score 04/20/20 1244 0     Pain Loc --      Pain Edu? --      Excl. in [place]? --    No data found.  Updated Vital Signs BP 111/72 (BP Location: Right Arm)   Pulse 72   Temp 98.3 F (36.8 C) (Oral)   Resp 14   LMP 04/18/2020 (Exact Date)   SpO2 98%   Visual Acuity Right Eye Distance:   Left Eye Distance:   Bilateral Distance:    Right Eye Near:   Left Eye Near:    Bilateral Near:     Physical Exam Vitals and nursing note reviewed.  Constitutional:      Appearance: She is well-developed.     Comments: No acute distress  HENT:     Head: Normocephalic and atraumatic.     Nose: Nose normal.     Mouth/Throat:     Comments: Oral mucosa pink and moist, no tonsillar enlargement or exudate. Posterior pharynx patent and nonerythematous,  no uvula deviation or  swelling. Normal phonation. Eyes:     Extraocular Movements: Extraocular movements intact.     Conjunctiva/sclera: Conjunctivae normal.     Pupils: Pupils are equal, round, and reactive to light.     Comments: Bilateral conjunctive are mildly erythematous, upper and lower lids with mild swelling, no overlying erythema bilaterally  Cardiovascular:     Rate and Rhythm: Normal rate.  Pulmonary:     Effort: Pulmonary effort is normal. No respiratory distress.  Abdominal:     General: There is no distension.  Musculoskeletal:        General: Normal range of motion.     Cervical back: Neck supple.  Skin:    General: Skin is warm and dry.  Neurological:     Mental Status: She is alert and oriented to person, place, and time.      UC Treatments / Results  Labs (all labs ordered are listed, but only abnormal results are displayed) Labs Reviewed - No data to display  EKG   Radiology No results found.  Procedures Procedures (including critical care time)  Medications Ordered in UC Medications - No data to display  Initial Impression / Assessment and Plan / UC Course  I have reviewed the triage vital signs and the nursing notes.  Pertinent labs & imaging results that were available during my care of the patient were reviewed by me and considered in my medical decision making (see chart for details).     Suspect likely allergic conjunctivitis, do not suspect infectious etiology at this time.  No red flags for emergency.  Treating with olopatadine and symptomatic and supportive care, initiated on cetirizine to further help with any other allergy symptoms.  Discussed strict return precautions. Patient verbalized understanding and is agreeable with plan.  Final Clinical Impressions(s) / UC Diagnoses   Final diagnoses:  Allergic conjunctivitis of both eyes     Discharge Instructions     Allergic Conjunctivitis  -Avoid exposure to allergens (dust, pollen, etc.)  - Use  artificial tears 5-6 times a day  - Use Naphcon-A (over the counter) for redness and itching relief  - Olopatadine eye drops for itching  - Use Cold Compresses for relief    ED Prescriptions    Medication Sig Dispense Auth. Provider   Cetirizine HCl 10 MG CAPS Take 1 capsule (10 mg total) by mouth daily. 30 capsule Leathia Farnell C, PA-C   olopatadine (PATANOL) 0.1 % ophthalmic solution Place 1 drop into both eyes 2 (two) times daily. 5 mL Athen Riel, [place] C, PA-C     PDMP not reviewed this encounter.   Janith Lima, PA-C 04/20/20 1314

## 2020-05-13 ENCOUNTER — Ambulatory Visit (HOSPITAL_COMMUNITY)
Admission: EM | Admit: 2020-05-13 | Discharge: 2020-05-13 | Disposition: A | Discharge status: Home / Self Care | Payer: Medicaid Other | Attending: Family Medicine | Admitting: Family Medicine

## 2020-05-13 ENCOUNTER — Other Ambulatory Visit: Payer: Self-pay

## 2020-05-13 ENCOUNTER — Encounter (HOSPITAL_COMMUNITY): Payer: Self-pay | Admitting: Emergency Medicine

## 2020-05-13 DIAGNOSIS — R112 Nausea with vomiting, unspecified: Secondary | ICD-10-CM

## 2020-05-13 DIAGNOSIS — R1011 Right upper quadrant pain: Secondary | ICD-10-CM

## 2020-05-13 MED ORDER — ONDANSETRON HCL 4 MG PO TABS: 4.0000 mg | ORAL_TABLET | Freq: Four times a day (QID) | ORAL | 0 refills | Status: DC

## 2020-05-13 NOTE — ED Triage Notes (Signed)
Pt here for vomiting x 2 days; denies other sx

## 2020-05-13 NOTE — ED Provider Notes (Signed)
[facility]   [id number] 05/13/20 Arrival Time: 1115  CC: ABDOMINAL PAIN  SUBJECTIVE:  Lauren [name] is a 24 y.o. female who presents with complaint of abdominal discomfort that began abruptly yesterday morning.  Reports that she vomited a lot at work yesterday, that she had to go home.  Also reports that she did not go to work today.  Reports that she is still having abdominal pain.  Reports history of gallbladder issues.  Reports pain in the area, notes it is 3 out of 10.  Reports is not the worst stomachache, and that she is being followed by her primary care provider.  Requesting note for work for today  Denies fever, chills, appetite changes, weight changes, nausea, vomiting, chest pain, SOB, diarrhea, constipation, hematochezia, melena, dysuria, difficulty urinating, increased frequency or urgency, flank pain, loss of bowel or bladder function, vaginal discharge, vaginal odor, vaginal bleeding, dyspareunia, pelvic pain.     Patient's last menstrual period was 04/18/2020 (exact date).  ROS: As per HPI.  All other pertinent ROS negative.     Past Medical History:  Diagnosis Date   Dysrhythmia    Medical history non-contributory    Migraines    Past Surgical History:  Procedure Laterality Date   ADENOIDECTOMY W/ MYRINGOTOMY  462   24 Years old    TONSILLECTOMY  2274   24 Years old    TYMPANOSTOMY TUBE PLACEMENT Bilateral 1999, 20073, 437   87 weeks old, 24 year old and 24 years old    WISDOM TOOTH EXTRACTION  3344   24 Years old    Allergies  Allergen Reactions   Septra [Bactrim] Nausea And Vomiting   Sulfa Antibiotics Anaphylaxis and Nausea And Vomiting   Prednisone Other (See Comments)    Extreme crying   Codeine Palpitations   Other Itching, Swelling and Rash    TACO SEASONING (MARCUM BRAND FROM [employer])   No current facility-administered medications on file prior to encounter.   Current Outpatient Medications on File Prior to Encounter    Medication Sig Dispense Refill   Cetirizine HCl 10 MG CAPS Take 1 capsule (10 mg total) by mouth daily. 30 capsule 0   cyclobenzaprine (FLEXERIL) 10 MG tablet Take 1 tablet (10 mg) approximately 4 hours before your scheduled appointment. (Patient not taking: Reported on 05/13/2020) 2 tablet 0   etonogestrel (NEXPLANON) 68 MG IMPL implant 68 mg by Subdermal route once.     fluconazole (DIFLUCAN) 150 MG tablet Take 1 tablet (150 mg total) by mouth daily. Take 2nd dose in 3 days if still symptoms. (Patient not taking: Reported on 05/13/2020) 2 tablet 0   HYDROcodone-homatropine (HYCODAN) 5-1.5 MG/5ML syrup Take 5 mLs by mouth every 6 (six) hours as needed for cough. (Patient not taking: Reported on 05/13/2020) 120 mL 0   ibuprofen (ADVIL,MOTRIN) 600 MG tablet Take 1 tablet (600 mg total) by mouth every 6 (six) hours as needed. (Patient not taking: Reported on 06/29/2019) 30 tablet 0   olopatadine (PATANOL) 0.1 % ophthalmic solution Place 1 drop into both eyes 2 (two) times daily. 5 mL 0   Social History   Socioeconomic History   Marital status: Single    Spouse name: Not on file   Number of children: Not on file   Years of education: Not on file   Highest education level: Not on file  Occupational History   Not on file  Tobacco Use   Smoking status: Passive Smoke Exposure - Never Smoker   Smokeless  tobacco: Never Used   Tobacco comment: Mom smokes   Vaping Use   Vaping Use: Never used  Substance and Sexual Activity   Alcohol use: Yes    Comment: occ   Drug use: No   Sexual activity: Yes    Birth control/protection: Implant  Other Topics Concern   Not on file  Social History Narrative   Not on file   Social Determinants of Health   Financial Resource Strain:    Difficulty of Paying Living Expenses:   Food Insecurity:    Worried About Programme researcher, broadcasting/film/video in the Last Year:    Barista in the Last Year:   Transportation Needs:    Freight forwarder  (Medical):    Lack of Transportation (Non-Medical):   Physical Activity:    Days of Exercise per Week:    Minutes of Exercise per Session:   Stress:    Feeling of Stress :   Social Connections:    Frequency of Communication with Friends and Family:    Frequency of Social Gatherings with Friends and Family:    Attends Religious Services:    Active Member of Clubs or Organizations:    Attends Engineer, structural:    Marital Status:   Intimate Partner Violence:    Fear of Current or Ex-Partner:    Emotionally Abused:    Physically Abused:    Sexually Abused:    Family History  Problem Relation Age of Onset   Migraines Mother    Menstrual problems Mother    Diabetes Mother    Anxiety disorder Mother        xanax   Depression Mother        prozac   Thyroid disease Sister    Diabetes Maternal Grandmother    Dementia Maternal Grandmother        Died at 30   Diabetes Maternal Grandfather    Emphysema Maternal Grandfather        Died at 79   Febrile seizures Brother        Died at the age of 20 years old   Pneumonia Paternal Grandmother        Died at 45   Heart failure Paternal Grandfather        Died at 42     OBJECTIVE:  Vitals:   05/13/20 1224  BP: 129/66  Pulse: 80  Resp: 18  Temp: 98.4 F (36.9 C)  TempSrc: Oral  SpO2: 100%    General appearance: Alert; NAD HEENT: NCAT.  Oropharynx clear.  Lungs: clear to auscultation bilaterally without adventitious breath sounds Heart: regular rate and rhythm.  Radial pulses 2+ symmetrical bilaterally Abdomen: soft, non-distended; normal active bowel sounds; non-tender to light and deep palpation; nontender at McBurney's point; negative Murphy's sign; negative rebound; no guarding Back: no CVA tenderness Extremities: no edema; symmetrical with no gross deformities Skin: warm and dry Neurologic: normal gait Psychological: alert and cooperative; normal mood and affect  LABS: No  results found for this or any previous visit (from the past 24 hour(s)).  DIAGNOSTIC STUDIES: No results found.   ASSESSMENT & PLAN:  1. Right upper quadrant abdominal pain   2. Nausea and vomiting, intractability of vomiting not specified, unspecified vomiting type     Meds ordered this encounter  Medications   ondansetron (ZOFRAN) 4 MG tablet    Sig: Take 1 tablet (4 mg total) by mouth every 6 (six) hours.    Dispense:  12  tablet    Refill:  0    Order Specific Question:   Supervising Provider    Answer:   Merrilee Jansky [id number]     Get rest and drink fluids Zofran prescribed.  Take as directed.    DIET Instructions:  30 minutes after taking nausea medicine, begin with sips of clear liquids. If able to hold down 2 - 4 ounces for 30 minutes, begin drinking more. Increase your fluid intake to replace losses. Clear liquids only for 24 hours (water, tea, sport drinks, clear flat ginger ale or cola and juices, broth, jello, popsicles, ect). Advance to bland foods, applesauce, rice, baked or boiled chicken, ect. Avoid milk, greasy foods and anything that doesnt agree with you.  If you experience new or worsening symptoms return or go to ER such as fever, chills, nausea, vomiting, diarrhea, bloody or dark tarry stools, constipation, urinary symptoms, worsening abdominal discomfort, symptoms that do not improve with medications, inability to keep fluids down.  Reviewed expectations re: course of current medical issues. Questions answered. Outlined signs and symptoms indicating need for more acute intervention. Patient verbalized understanding. After Visit Summary given.    Moshe Cipro, NP 05/13/20 1350

## 2020-05-13 NOTE — Discharge Instructions (Signed)
Sent in Zofran to your pharmacy for you to use for nausea  If you continue to have issues, follow-up with his office or with primary care  It may be beneficial for you to see a GI specialist

## 2020-05-16 ENCOUNTER — Other Ambulatory Visit: Payer: Self-pay

## 2020-05-16 ENCOUNTER — Encounter: Payer: Self-pay | Admitting: Family

## 2020-05-16 ENCOUNTER — Ambulatory Visit (INDEPENDENT_AMBULATORY_CARE_PROVIDER_SITE_OTHER): Payer: Medicaid Other | Admitting: Family

## 2020-05-16 ENCOUNTER — Other Ambulatory Visit (HOSPITAL_COMMUNITY)
Admission: RE | Admit: 2020-05-16 | Discharge: 2020-05-16 | Disposition: A | Discharge status: Home / Self Care | Payer: Medicaid Other | Source: Ambulatory Visit | Attending: Family | Admitting: Family

## 2020-05-16 VITALS — BP 107/72 | HR 77 | Ht 64.17 in | Wt 139.6 lb

## 2020-05-16 DIAGNOSIS — Z113 Encounter for screening for infections with a predominantly sexual mode of transmission: Secondary | ICD-10-CM | POA: Diagnosis not present

## 2020-05-16 DIAGNOSIS — Z789 Other specified health status: Secondary | ICD-10-CM | POA: Diagnosis not present
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
## 2020-05-16 NOTE — Patient Instructions (Signed)
Adult Primary Care Clinics Name Criteria Services   [facility]  Address: [address] [place], [place] [address]  Phone: [phone] Hours: Monday - Friday 9 AM -6 PM  Types of insurance accepted:  Commercial insurance [facility] (orange card) Medicaid Medicare Uninsured  Language services:  Video and phone interpreters available   Ages 18 and older    Adult primary care Onsite pharmacy Integrated behavioral health Financial assistance counseling Walk-in hours for established patients  Financial assistance counseling hours: Tuesdays 2:00PM - 5:00PM  Thursday 8:30AM - 4:30PM  Space is limited, 10 on Tuesday and 20 on Thursday. It's on first come first serve basis  Name Criteria Services   [facility]  Address: [address] [place], [place] [address]  Phone: [phone]  Hours: Monday - Friday 8:30 AM - 5 PM  Types of insurance accepted:  Commercial insurance Medicaid Medicare Uninsured  Language services:  Video and phone interpreters available   All ages - newborn to adult   Primary care for all ages (children and adults) Integrated behavioral health Nutritionist Financial assistance counseling   Name Criteria Services   [facility]  Located on the ground floor of [facility]  Address: [address]  [place],  [place]  [address]  Phone: [phone]  Hours: Monday - Friday 8:15 AM - 5 PM  Types of insurance accepted:  Commercial insurance Medicaid Medicare Uninsured  Language services:  Video and phone interpreters available   Ages 18 and older   Adult primary care Nutritionist Certified Diabetes Educator  Integrated behavioral health Financial assistance counseling   Name Criteria Services   [facility]  Address: [address] [place], [place] [address]  Phone:  [phone]  Hours: Monday - Friday 8:30 AM - 5 PM    Types of insurance accepted:  Commercial insurance Medicaid Medicare Uninsured  Language services:  Video and phone interpreters available   All ages - newborn to adult   Primary care for all ages (children and adults) Integrated behavioral health Financial assistance counseling    

## 2020-05-17 LAB — WET PREP BY MOLECULAR PROBE
Candida species: NOT DETECTED
MICRO NUMBER:: 10680687
SPECIMEN QUALITY:: ADEQUATE
Trichomonas vaginosis: NOT DETECTED

## 2020-05-17 LAB — URINE CYTOLOGY ANCILLARY ONLY
Chlamydia: NEGATIVE
Comment: NEGATIVE
Comment: NORMAL
Neisseria Gonorrhea: NEGATIVE

## 2020-05-17 LAB — RPR: RPR Ser Ql: NONREACTIVE

## 2020-05-17 LAB — HIV ANTIBODY (ROUTINE TESTING W REFLEX): HIV 1&2 Ab, 4th Generation: NONREACTIVE

## 2020-05-22 ENCOUNTER — Encounter: Payer: Self-pay | Admitting: Family

## 2020-05-31 ENCOUNTER — Emergency Department (HOSPITAL_COMMUNITY)
Admission: EM | Admit: 2020-05-31 | Discharge: 2020-05-31 | Disposition: A | Discharge status: Home / Self Care | Payer: Medicaid Other | Attending: Emergency Medicine | Admitting: Emergency Medicine

## 2020-05-31 ENCOUNTER — Encounter (HOSPITAL_COMMUNITY): Payer: Self-pay | Admitting: Pediatrics

## 2020-05-31 ENCOUNTER — Other Ambulatory Visit: Payer: Self-pay

## 2020-05-31 DIAGNOSIS — R112 Nausea with vomiting, unspecified: Secondary | ICD-10-CM | POA: Diagnosis not present

## 2020-05-31 DIAGNOSIS — R109 Unspecified abdominal pain: Secondary | ICD-10-CM | POA: Insufficient documentation

## 2020-05-31 DIAGNOSIS — Z5321 Procedure and treatment not carried out due to patient leaving prior to being seen by health care provider: Secondary | ICD-10-CM | POA: Insufficient documentation

## 2020-05-31 LAB — URINALYSIS, ROUTINE W REFLEX MICROSCOPIC
Bilirubin Urine: NEGATIVE
Glucose, UA: NEGATIVE mg/dL
Ketones, ur: NEGATIVE mg/dL
Leukocytes,Ua: NEGATIVE
Nitrite: NEGATIVE
Protein, ur: NEGATIVE mg/dL
Specific Gravity, Urine: 1.013 (ref 1.005–1.030)
pH: 5 (ref 5.0–8.0)

## 2020-05-31 LAB — COMPREHENSIVE METABOLIC PANEL
ALT: 18 U/L (ref 0–44)
AST: 17 U/L (ref 15–41)
Albumin: 3.7 g/dL (ref 3.5–5.0)
Alkaline Phosphatase: 55 U/L (ref 38–126)
Anion gap: 9 (ref 5–15)
BUN: 7 mg/dL (ref 6–20)
CO2: 23 mmol/L (ref 22–32)
Calcium: 9.3 mg/dL (ref 8.9–10.3)
Chloride: 106 mmol/L (ref 98–111)
Creatinine, Ser: 0.6 mg/dL (ref 0.44–1.00)
GFR calc Af Amer: >60 mL/min (ref >60)
GFR calc non Af Amer: >60 mL/min (ref >60)
Glucose, Bld: 89 mg/dL (ref 70–99)
Potassium: 3.9 mmol/L (ref 3.5–5.1)
Sodium: 138 mmol/L (ref 135–145)
Total Bilirubin: 1.3 mg/dL — ABNORMAL HIGH (ref 0.3–1.2)
Total Protein: 6.9 g/dL (ref 6.5–8.1)

## 2020-05-31 LAB — CBC
HCT: 41.4 % (ref 36.0–46.0)
Hemoglobin: 13.4 g/dL (ref 12.0–15.0)
MCH: 30 pg (ref 26.0–34.0)
MCHC: 32.4 g/dL (ref 30.0–36.0)
MCV: 92.8 fL (ref 80.0–100.0)
Platelets: 217 10*3/uL (ref 150–400)
RBC: 4.46 MIL/uL (ref 3.87–5.11)
RDW: 12.8 % (ref 11.5–15.5)
WBC: 7 10*3/uL (ref 4.0–10.5)
nRBC: 0 % (ref 0.0–0.2)

## 2020-05-31 LAB — I-STAT BETA HCG BLOOD, ED (MC, WL, AP ONLY): I-stat hCG, quantitative: <5 m[IU]/mL (ref <5)

## 2020-05-31 LAB — LIPASE, BLOOD: Lipase: 37 U/L (ref 11–51)

## 2020-05-31 MED ORDER — SODIUM CHLORIDE 0.9% FLUSH: 3.0000 mL | Freq: Once | INTRAVENOUS | Status: DC

## 2020-05-31 NOTE — ED Triage Notes (Signed)
C/O abdominal pain since yesterday + NV. Stated hx of gallbladder problems

## 2020-06-03 ENCOUNTER — Telehealth: Payer: Self-pay | Admitting: *Deleted

## 2020-06-03 NOTE — Telephone Encounter (Signed)
Call received from [name] in response to [facility] general discharge transition call. Patient has a NP appointment with Dr Laural Benes on 10/11/2 at [facility].

## 2020-06-03 NOTE — Telephone Encounter (Signed)
Medicaid Managed Care team Transition of Care Assessment outreach attempt #1 made today. Unable to reach patient. HIPPA compliant voice message left requesting a return call. The patient has also been enrolled in an automated discharge follow up call series and will receive two outreach attempts for transition of care assessment. Contact information has been left for the patient and the Medicaid Managed Care team is available to provide assistance to the patient at any time.  ° °Katrice Jaymison Luber, RN, BSN, CCRN °Patient Engagement Center °[phone] ° °

## 2020-06-03 NOTE — Telephone Encounter (Signed)
  Transition Care Management Follow-up Telephone Call  . Medicaid Managed Care Transition Call Status:MM [facility] Call Made  . Date of discharge and from where: [facility], 05/31/20; pt left after triage without being seen because "too much was going on in there"  . How have you been since you were released from the hospital? "I'm OK"  . Any questions or concerns? No concerns; pt would like a PCP   Items Reviewed: Marland Kitchen Did the pt receive and understand the discharge instructions provided? n/a . Medications obtained and verified? n/a . Any new allergies since your discharge? n/a . Dietary orders reviewed? N/a . Do you have support at home? n/a  Follow up appointments reviewed:  PCP Hospital f/u appt confirmed? No   Pt would like to be assigned a PCP Specialist Hospital f/u appt confirmed? n/a Are transportation arrangements needed? No   If their condition worsens, is the pt aware to call PCP or go to the EmergencyDept. yes Was the patient provided with contact information for the PCP's office or ED? Yes  Was to pt encouraged to call back with questions or concerns?  Yes  Burnard Bunting, RN, BSN, CCRN Patient Engagement Center [phone]

## 2020-06-21 ENCOUNTER — Encounter (HOSPITAL_COMMUNITY): Payer: Self-pay

## 2020-06-21 ENCOUNTER — Ambulatory Visit (HOSPITAL_COMMUNITY)
Admission: EM | Admit: 2020-06-21 | Discharge: 2020-06-21 | Disposition: A | Discharge status: Home / Self Care | Payer: Medicaid Other | Attending: Urgent Care | Admitting: Urgent Care

## 2020-06-21 ENCOUNTER — Other Ambulatory Visit: Payer: Self-pay

## 2020-06-21 DIAGNOSIS — R05 Cough: Secondary | ICD-10-CM

## 2020-06-21 DIAGNOSIS — J069 Acute upper respiratory infection, unspecified: Secondary | ICD-10-CM | POA: Insufficient documentation

## 2020-06-21 DIAGNOSIS — R067 Sneezing: Secondary | ICD-10-CM | POA: Diagnosis not present

## 2020-06-21 DIAGNOSIS — Z20822 Contact with and (suspected) exposure to covid-19: Secondary | ICD-10-CM | POA: Insufficient documentation

## 2020-06-21 DIAGNOSIS — R0981 Nasal congestion: Secondary | ICD-10-CM | POA: Insufficient documentation

## 2020-06-21 DIAGNOSIS — J3089 Other allergic rhinitis: Secondary | ICD-10-CM | POA: Diagnosis not present

## 2020-06-21 DIAGNOSIS — R059 Cough, unspecified: Secondary | ICD-10-CM

## 2020-06-21 LAB — SARS CORONAVIRUS 2 (TAT 6-24 HRS): SARS Coronavirus 2: NEGATIVE

## 2020-06-21 MED ORDER — PSEUDOEPHEDRINE HCL 60 MG PO TABS: 60.0000 mg | ORAL_TABLET | Freq: Three times a day (TID) | ORAL | 0 refills | Status: DC | PRN

## 2020-06-21 MED ORDER — PROMETHAZINE-DM 6.25-15 MG/5ML PO SYRP: 5.0000 mL | ORAL_SOLUTION | Freq: Every evening | ORAL | 0 refills | Status: DC | PRN

## 2020-06-21 MED ORDER — CETIRIZINE HCL 10 MG PO TABS: 10.0000 mg | ORAL_TABLET | Freq: Every day | ORAL | 0 refills | Status: DC

## 2020-06-21 MED ORDER — BENZONATATE 100 MG PO CAPS: 100.0000 mg | ORAL_CAPSULE | Freq: Three times a day (TID) | ORAL | 0 refills | Status: DC | PRN

## 2020-06-21 NOTE — ED Triage Notes (Signed)
Pt presents for covid testing after positive exposure from multiple family members; pt states she has had slight sneezing and coughing X 1 week.

## 2020-06-21 NOTE — ED Provider Notes (Signed)
[facility]   MRN: [id number] DOB: 04-12-1996  Subjective:   ILEE [name] is a 24 y.o. female presenting for 1 week history of persistent sinus congestion, sneezing, cough.  Patient had Covid exposure through multiple family members.  Denies fever, chest pain, shortness of breath, body aches, loss of sense of taste and smell.  Has a history of allergic rhinitis, takes Claritin for this.  Denies taking chronic medications.  Allergies  Allergen Reactions  . Septra [Bactrim] Nausea And Vomiting  . Sulfa Antibiotics Anaphylaxis and Nausea And Vomiting  . Prednisone Other (See Comments)    Extreme crying  . Codeine Palpitations  . Other Itching, Swelling and Rash    TACO SEASONING (MARCUM BRAND FROM [employer])    Past Medical History:  Diagnosis Date  . Dysrhythmia   . Medical history non-contributory   . Migraines      Past Surgical History:  Procedure Laterality Date  . ADENOIDECTOMY W/ MYRINGOTOMY  6523   24 Years old   . TONSILLECTOMY  8528   24 Years old   . TYMPANOSTOMY TUBE PLACEMENT Bilateral 1999, 20028, 3775   37 weeks old, 24 year old and 24 years old   . WISDOM TOOTH EXTRACTION  2332   24 Years old     Family History  Problem Relation Age of Onset  . Migraines Mother   . Menstrual problems Mother   . Diabetes Mother   . Anxiety disorder Mother        xanax  . Depression Mother        prozac  . Thyroid disease Sister   . Diabetes Maternal Grandmother   . Dementia Maternal Grandmother        Died at 8  . Diabetes Maternal Grandfather   . Emphysema Maternal Grandfather        Died at 79  . Febrile seizures Brother        Died at the age of 60 years old  . Pneumonia Paternal Grandmother        Died at 54  . Heart failure Paternal Grandfather        Died at 81    Social History   Tobacco Use  . Smoking status: Passive Smoke Exposure - Never Smoker  . Smokeless tobacco: Never Used  . Tobacco comment: Mom smokes   Vaping Use  . Vaping  Use: Never used  Substance Use Topics  . Alcohol use: Yes    Comment: occ  . Drug use: No    ROS   Objective:   Vitals: BP 108/74 (BP Location: Left Arm)   Pulse 90   Temp 98.9 F (37.2 C) (Oral)   Resp 18   LMP 06/16/2020   SpO2 100%   Physical Exam Constitutional:      General: She is not in acute distress.    Appearance: Normal appearance. She is well-developed. She is not ill-appearing, toxic-appearing or diaphoretic.  HENT:     Head: Normocephalic and atraumatic.     Right Ear: Tympanic membrane and ear canal normal. No drainage or tenderness. No middle ear effusion. Tympanic membrane is not erythematous.     Left Ear: Tympanic membrane and ear canal normal. No drainage or tenderness.  No middle ear effusion. Tympanic membrane is not erythematous.     Nose: Nose normal. No congestion or rhinorrhea.     Mouth/Throat:     Mouth: Mucous membranes are moist. No oral lesions.     Pharynx: No pharyngeal  swelling, oropharyngeal exudate, posterior oropharyngeal erythema or uvula swelling.     Tonsils: No tonsillar exudate or tonsillar abscesses.     Comments: Thick streaks of postnasal drainage overlying pharynx. Eyes:     Extraocular Movements: Extraocular movements intact.     Right eye: Normal extraocular motion.     Left eye: Normal extraocular motion.     Conjunctiva/sclera: Conjunctivae normal.     Pupils: Pupils are equal, round, and reactive to light.  Cardiovascular:     Rate and Rhythm: Normal rate and regular rhythm.     Pulses: Normal pulses.     Heart sounds: Normal heart sounds. No murmur heard.  No friction rub. No gallop.   Pulmonary:     Effort: Pulmonary effort is normal. No respiratory distress.     Breath sounds: Normal breath sounds. No stridor. No wheezing, rhonchi or rales.  Musculoskeletal:     Cervical back: Normal range of motion and neck supple.  Lymphadenopathy:     Cervical: No cervical adenopathy.  Skin:    General: Skin is warm and  dry.     Findings: No rash.  Neurological:     General: No focal deficit present.     Mental Status: She is alert and oriented to person, place, and time.  Psychiatric:        Mood and Affect: Mood normal.        Behavior: Behavior normal.        Thought Content: Thought content normal.       Assessment and Plan :   PDMP not reviewed this encounter.  1. Viral URI with cough   2. Nasal congestion   3. Cough   4. Sneezing   5. Close exposure to COVID-19 virus   6. Allergic rhinitis due to other allergic trigger, unspecified seasonality     Will manage for viral illness such as viral URI, viral syndrome, viral rhinitis, COVID-19. Counseled patient on nature of COVID-19 including modes of transmission, diagnostic testing, management and supportive care.  Offered scripts for symptomatic relief. COVID 19 testing is pending. Counseled patient on potential for adverse effects with medications prescribed/recommended today, ER and return-to-clinic precautions discussed, patient verbalized understanding.     Wallis Bamberg, [place] 06/21/20 [phone]

## 2020-06-21 NOTE — Discharge Instructions (Addendum)

## 2020-07-25 ENCOUNTER — Other Ambulatory Visit: Payer: Self-pay

## 2020-07-25 ENCOUNTER — Inpatient Hospital Stay (HOSPITAL_COMMUNITY): Payer: Medicaid Other

## 2020-07-25 ENCOUNTER — Encounter (HOSPITAL_COMMUNITY): Payer: Self-pay | Admitting: Obstetrics and Gynecology

## 2020-07-25 ENCOUNTER — Inpatient Hospital Stay (HOSPITAL_COMMUNITY)
Admission: AD | Admit: 2020-07-25 | Discharge: 2020-07-25 | Disposition: A | Discharge status: Home / Self Care | Payer: Medicaid Other | Attending: Obstetrics and Gynecology | Admitting: Obstetrics and Gynecology

## 2020-07-25 DIAGNOSIS — O98311 Other infections with a predominantly sexual mode of transmission complicating pregnancy, first trimester: Secondary | ICD-10-CM | POA: Diagnosis not present

## 2020-07-25 DIAGNOSIS — O4691 Antepartum hemorrhage, unspecified, first trimester: Secondary | ICD-10-CM | POA: Diagnosis not present

## 2020-07-25 DIAGNOSIS — O209 Hemorrhage in early pregnancy, unspecified: Secondary | ICD-10-CM | POA: Diagnosis not present

## 2020-07-25 DIAGNOSIS — Z881 Allergy status to other antibiotic agents status: Secondary | ICD-10-CM | POA: Diagnosis not present

## 2020-07-25 DIAGNOSIS — Z885 Allergy status to narcotic agent status: Secondary | ICD-10-CM | POA: Diagnosis not present

## 2020-07-25 DIAGNOSIS — A599 Trichomoniasis, unspecified: Secondary | ICD-10-CM | POA: Diagnosis not present

## 2020-07-25 DIAGNOSIS — O3680X0 Pregnancy with inconclusive fetal viability, not applicable or unspecified: Secondary | ICD-10-CM | POA: Diagnosis not present

## 2020-07-25 DIAGNOSIS — Z3A01 Less than 8 weeks gestation of pregnancy: Secondary | ICD-10-CM | POA: Insufficient documentation

## 2020-07-25 DIAGNOSIS — Z888 Allergy status to other drugs, medicaments and biological substances status: Secondary | ICD-10-CM | POA: Diagnosis not present

## 2020-07-25 DIAGNOSIS — Z882 Allergy status to sulfonamides status: Secondary | ICD-10-CM | POA: Insufficient documentation

## 2020-07-25 LAB — CBC
HCT: 39.7 % (ref 36.0–46.0)
Hemoglobin: 13 g/dL (ref 12.0–15.0)
MCH: 29.9 pg (ref 26.0–34.0)
MCHC: 32.7 g/dL (ref 30.0–36.0)
MCV: 91.3 fL (ref 80.0–100.0)
Platelets: 245 10*3/uL (ref 150–400)
RBC: 4.35 MIL/uL (ref 3.87–5.11)
RDW: 12.4 % (ref 11.5–15.5)
WBC: 7.9 10*3/uL (ref 4.0–10.5)
nRBC: 0 % (ref 0.0–0.2)

## 2020-07-25 LAB — URINALYSIS, ROUTINE W REFLEX MICROSCOPIC
Bilirubin Urine: NEGATIVE
Glucose, UA: NEGATIVE mg/dL
Ketones, ur: NEGATIVE mg/dL
Nitrite: NEGATIVE
Protein, ur: 30 mg/dL — AB
RBC / HPF: >50 RBC/hpf — ABNORMAL HIGH (ref 0–5)
Specific Gravity, Urine: 1.028 (ref 1.005–1.030)
WBC, UA: >50 WBC/hpf — ABNORMAL HIGH (ref 0–5)
pH: 5 (ref 5.0–8.0)

## 2020-07-25 LAB — WET PREP, GENITAL
Sperm: NONE SEEN
Yeast Wet Prep HPF POC: NONE SEEN

## 2020-07-25 LAB — POCT PREGNANCY, URINE: Preg Test, Ur: POSITIVE — AB

## 2020-07-25 LAB — ABO/RH: ABO/RH(D): O POS

## 2020-07-25 LAB — HCG, QUANTITATIVE, PREGNANCY: hCG, Beta Chain, Quant, S: 625 m[IU]/mL — ABNORMAL HIGH (ref <5)

## 2020-07-25 MED ORDER — METRONIDAZOLE 500 MG PO TABS
2000.0000 mg | ORAL_TABLET | Freq: Once | ORAL | Status: AC
  Administered 2020-07-25: 2000 mg via ORAL
  Filled 2020-07-25: qty 4

## 2020-07-25 NOTE — Progress Notes (Signed)
Wynelle Bourgeois CNM in earlier to discuss lab results and d/c plan. WRitten and verbal d/c instructions given and understanding voiced.

## 2020-07-25 NOTE — Discharge Instructions (Signed)
Vaginal Bleeding During Pregnancy, First Trimester  A small amount of bleeding from the vagina (spotting) is relatively common during early pregnancy. It usually stops on its own. Various things may cause bleeding or spotting during early pregnancy. Some bleeding may be related to the pregnancy, and some may not. In many cases, the bleeding is normal and is not a problem. However, bleeding can also be a sign of something serious. Be sure to tell your health care provider about any vaginal bleeding right away. Some possible causes of vaginal bleeding during the first trimester include:  Infection or inflammation of the cervix.  Growths (polyps) on the cervix.  Miscarriage or threatened miscarriage.  Pregnancy tissue developing outside of the uterus (ectopic pregnancy).  A mass of tissue developing in the uterus due to an egg being fertilized incorrectly (molar pregnancy). Follow these instructions at home: Activity  Follow instructions from your health care provider about limiting your activity. Ask what activities are safe for you.  If needed, make plans for someone to help with your regular activities.  Do not have sex or orgasms until your health care provider says that this is safe. General instructions  Take over-the-counter and prescription medicines only as told by your health care provider.  Pay attention to any changes in your symptoms.  Do not use tampons or douche.  Write down how many pads you use each day, how often you change pads, and how soaked (saturated) they are.  If you pass any tissue from your vagina, save the tissue so you can show it to your health care provider.  Keep all follow-up visits as told by your health care provider. This is important. Contact a health care provider if:  You have vaginal bleeding during any part of your pregnancy.  You have cramps or labor pains.  You have a fever. Get help right away if:  You have severe cramps in your  back or abdomen.  You pass large clots or a large amount of tissue from your vagina.  Your bleeding increases.  You feel light-headed or weak, or you faint.  You have chills.  You are leaking fluid or have a gush of fluid from your vagina. Summary  A small amount of bleeding (spotting) from the vagina is relatively common during early pregnancy.  Various things may cause bleeding or spotting in early pregnancy.  Be sure to tell your health care provider about any vaginal bleeding right away. This information is not intended to replace advice given to you by your health care provider. Make sure you discuss any questions you have with your health care provider. Document Revised: 02/14/2019 Document Reviewed: 01/28/2017 Elsevier Patient Education  2020 [employer].   Trichomoniasis Trichomoniasis is an STI (sexually transmitted infection) that can affect both women and men. In women, the outer area of the female genitalia (vulva) and the vagina are affected. In men, mainly the penis is affected, but the prostate and other reproductive organs can also be involved.  This condition can be treated with medicine. It often has no symptoms (is asymptomatic), especially in men. If not treated, trichomoniasis can last for months or years. What are the causes? This condition is caused by a parasite called Trichomonas vaginalis. Trichomoniasis most often spreads from person to person (is contagious) through sexual contact. What increases the risk? The following factors may make you more likely to develop this condition:  Having unprotected sex.  Having sex with a partner who has trichomoniasis.  Having multiple  sexual partners.  Having had previous trichomoniasis infections or other STIs. What are the signs or symptoms? In women, symptoms of trichomoniasis include:  Abnormal vaginal discharge that is clear, white, gray, or yellow-green and foamy and has an unusual "fishy"  odor.  Itching and irritation of the vagina and vulva.  Burning or pain during urination or sex.  Redness and swelling of the genitals. In men, symptoms of trichomoniasis include:  Penile discharge that may be foamy or contain pus.  Pain in the penis. This may happen only when urinating.  Itching or irritation inside the penis.  Burning after urination or ejaculation. How is this diagnosed? In women, this condition may be found during a routine Pap test or physical exam. It may be found in men during a routine physical exam. Your health care provider may do tests to help diagnose this infection, such as:  Urine tests (men and women).  The following in women: ? Testing the pH of the vagina. ? A vaginal swab test that checks for the Trichomonas vaginalis parasite. ? Testing vaginal secretions. Your health care provider may test you for other STIs, including HIV (human immunodeficiency virus). How is this treated? This condition is treated with medicine taken by mouth (orally), such as metronidazole or tinidazole, to fight the infection. Your sexual partner(s) also need to be tested and treated.  If you are a woman and you plan to become pregnant or think you may be pregnant, tell your health care provider right away. Some medicines that are used to treat the infection should not be taken during pregnancy. Your health care provider may recommend over-the-counter medicines or creams to help relieve itching or irritation. You may be tested for infection again 3 months after treatment. Follow these instructions at home:  Take and use over-the-counter and prescription medicines, including creams, only as told by your health care provider.  Take your antibiotic medicine as told by your health care provider. Do not stop taking the antibiotic even if you start to feel better.  Do not have sex until 7-10 days after you finish your medicine, or until your health care provider approves. Ask  your health care provider when you may start to have sex again.  (Women) Do not douche or wear tampons while you have the infection.  Discuss your infection with your sexual partner(s). Make sure that your partner gets tested and treated, if necessary.  Keep all follow-up visits as told by your health care provider. This is important. How is this prevented?   Use condoms every time you have sex. Using condoms correctly and consistently can help protect against STIs.  Avoid having multiple sexual partners.  Talk with your sexual partner about any symptoms that either of you may have, as well as any history of STIs.  Get tested for STIs and STDs (sexually transmitted diseases) before you have sex. Ask your partner to do the same.  Do not have sexual contact if you have symptoms of trichomoniasis or another STI. Contact a health care provider if:  You still have symptoms after you finish your medicine.  You develop pain in your abdomen.  You have pain when you urinate.  You have bleeding after sex.  You develop a rash.  You feel nauseous or you vomit.  You plan to become pregnant or think you may be pregnant. Summary  Trichomoniasis is an STI (sexually transmitted infection) that can affect both women and men.  This condition often has no symptoms (is  asymptomatic), especially in men.  Without treatment, this condition can last for months or years.  You should not have sex until 7-10 days after you finish your medicine, or until your health care provider approves. Ask your health care provider when you may start to have sex again.  Discuss your infection with your sexual partner(s). Make sure that your partner gets tested and treated, if necessary. This information is not intended to replace advice given to you by your health care provider. Make sure you discuss any questions you have with your health care provider. Document Revised: 08/09/2018 Document Reviewed:  08/09/2018 Elsevier Patient Education  2020 [employer].

## 2020-07-25 NOTE — MAU Provider Note (Addendum)
Chief Complaint: Vaginal Bleeding   First Provider Initiated Contact with Patient 07/25/20 2207        SUBJECTIVE HPI: Lauren [name] is a 24 y.o. G1P0000 at Unknown by LMP who presents to maternity admissions reporting pregnancy with bleeding that started tonight.  No cramping. . She denies vaginal itching/burning, urinary symptoms, h/a, dizziness, n/v, or fever/chills.    Vaginal Bleeding The patient's primary symptoms include vaginal bleeding. The patient's pertinent negatives include no genital itching, genital lesions, genital odor or pelvic pain. This is a new problem. The current episode started today. The problem occurs intermittently. The problem has been unchanged. Pertinent negatives include no back pain, chills, constipation, diarrhea, fever, nausea or vomiting. The vaginal discharge was bloody. The vaginal bleeding is lighter than menses. She has not been passing clots. She has not been passing tissue. Nothing aggravates the symptoms. She has tried nothing for the symptoms. She is sexually active. It is unknown whether or not her partner has an STD.   RN Note: When I used BR tonight I noticed I was bleeding. I am [redacted]wks pregnant. LMP 06/12/20. No pain. No recent intercourse. Has first appt at [facility] on 08/14/20  Past Medical History:  Diagnosis Date  . Dysrhythmia   . Medical history non-contributory   . Migraines    Past Surgical History:  Procedure Laterality Date  . ADENOIDECTOMY W/ MYRINGOTOMY  7477   24 Years old   . TONSILLECTOMY  5773   24 Years old   . TYMPANOSTOMY TUBE PLACEMENT Bilateral 1999, 20051, 6830   41 weeks old, 24 year old and 24 years old   . WISDOM TOOTH EXTRACTION  7434   24 Years old    Social History   Socioeconomic History  . Marital status: Single    Spouse name: Not on file  . Number of children: Not on file  . Years of education: Not on file  . Highest education level: Not on file  Occupational History  . Not on file  Tobacco Use  . Smoking  status: Passive Smoke Exposure - Never Smoker  . Smokeless tobacco: Never Used  . Tobacco comment: Mom smokes   Vaping Use  . Vaping Use: Never used  Substance and Sexual Activity  . Alcohol use: Yes    Comment: occ  . Drug use: No  . Sexual activity: Yes    Birth control/protection: None  Other Topics Concern  . Not on file  Social History Narrative  . Not on file   Social Determinants of Health   Financial Resource Strain:   . Difficulty of Paying Living Expenses: Not on file  Food Insecurity:   . Worried About Programme researcher, broadcasting/film/video in the Last Year: Not on file  . Ran Out of Food in the Last Year: Not on file  Transportation Needs:   . Lack of Transportation (Medical): Not on file  . Lack of Transportation (Non-Medical): Not on file  Physical Activity:   . Days of Exercise per Week: Not on file  . Minutes of Exercise per Session: Not on file  Stress:   . Feeling of Stress : Not on file  Social Connections:   . Frequency of Communication with Friends and Family: Not on file  . Frequency of Social Gatherings with Friends and Family: Not on file  . Attends Religious Services: Not on file  . Active Member of Clubs or Organizations: Not on file  . Attends Banker Meetings: Not on file  .  Marital Status: Not on file  Intimate Partner Violence:   . Fear of Current or Ex-Partner: Not on file  . Emotionally Abused: Not on file  . Physically Abused: Not on file  . Sexually Abused: Not on file   No current facility-administered medications on file prior to encounter.   Current Outpatient Medications on File Prior to Encounter  Medication Sig Dispense Refill  . cetirizine (ZYRTEC ALLERGY) 10 MG tablet Take 1 tablet (10 mg total) by mouth daily. 30 tablet 0  . Prenatal Vit-Fe Fumarate-FA (PRENATAL MULTIVITAMIN) TABS tablet Take 1 tablet by mouth daily at 12 noon.    . benzonatate (TESSALON) 100 MG capsule Take 1-2 capsules (100-200 mg total) by mouth 3 (three) times  daily as needed for cough. 60 capsule 0  . promethazine-dextromethorphan (PROMETHAZINE-DM) 6.25-15 MG/5ML syrup Take 5 mLs by mouth at bedtime as needed for cough. 100 mL 0  . pseudoephedrine (SUDAFED) 60 MG tablet Take 1 tablet (60 mg total) by mouth every 8 (eight) hours as needed for congestion. 30 tablet 0   Allergies  Allergen Reactions  . Septra [Bactrim] Nausea And Vomiting  . Sulfa Antibiotics Anaphylaxis and Nausea And Vomiting  . Prednisone Other (See Comments)    Extreme crying  . Codeine Palpitations  . Other Itching, Swelling and Rash    TACO SEASONING (MARCUM BRAND FROM [place])    I have reviewed patient's Past Medical Hx, Surgical Hx, Family Hx, Social Hx, medications and allergies.   ROS:  Review of Systems  Constitutional: Negative for chills and fever.  Gastrointestinal: Negative for constipation, diarrhea, nausea and vomiting.  Genitourinary: Positive for vaginal bleeding. Negative for pelvic pain.  Musculoskeletal: Negative for back pain.   Review of Systems  Other systems negative   Physical Exam  Physical Exam Patient Vitals for the past 24 hrs:  BP Temp Pulse Resp Height Weight  07/25/20 2119 114/66 98.4 F (36.9 C) 93 16 5\' 4"  (1.626 m) 64.9 kg   Constitutional: Well-developed, well-nourished female in no acute distress.  Cardiovascular: normal rate Respiratory: normal effort GI: Abd soft, non-tender. Pos BS x 4 MS: Extremities nontender, no edema, normal ROM Neurologic: Alert and oriented x 4.  GU: Neg CVAT.  PELVIC EXAM: Cervix pink, visually closed, without lesion, small bloody discharge, vaginal walls and external genitalia normal Bimanual exam: Cervix 0/long/high, firm, anterior, neg CMT, uterus nontender, nonenlarged, adnexa without tenderness, enlargement, or mass   LAB RESULTS Results for orders placed or performed during the hospital encounter of 07/25/20 (from the past 24 hour(s))  ABO/Rh     Status: None   Collection Time:  07/25/20  9:56 PM  Result Value Ref Range   ABO/RH(D) O POS    No rh immune globuloin      NOT A RH IMMUNE GLOBULIN CANDIDATE, PT RH POSITIVE Performed at [facility], [address] [place], [place] [address]   CBC     Status: None   Collection Time: 07/25/20  9:56 PM  Result Value Ref Range   WBC 7.9 4.0 - 10.5 K/uL   RBC 4.35 3.87 - 5.11 MIL/uL   Hemoglobin 13.0 12.0 - 15.0 g/dL   HCT 60.439.7 36 - 46 %   MCV 91.3 80.0 - 100.0 fL   MCH 29.9 26.0 - 34.0 pg   MCHC 32.7 30.0 - 36.0 g/dL   RDW 54.012.4 98.111.5 - 19.115.5 %   Platelets 245 150 - 400 K/uL   nRBC 0.0 0.0 - 0.2 %  hCG, quantitative, pregnancy     Status: Abnormal   Collection Time: 07/25/20  9:56 PM  Result Value Ref Range   hCG, Beta Chain, Quant, S 625 (H) <5 mIU/mL  Urinalysis, Routine w reflex microscopic PATH Cytology Cervicovaginal Ancillary Only     Status: Abnormal   Collection Time: 07/25/20 10:06 PM  Result Value Ref Range   Color, Urine YELLOW YELLOW   APPearance CLOUDY (A) CLEAR   Specific Gravity, Urine 1.028 1.005 - 1.030   pH 5.0 5.0 - 8.0   Glucose, UA NEGATIVE NEGATIVE mg/dL   Hgb urine dipstick LARGE (A) NEGATIVE   Bilirubin Urine NEGATIVE NEGATIVE   Ketones, ur NEGATIVE NEGATIVE mg/dL   Protein, ur 30 (A) NEGATIVE mg/dL   Nitrite NEGATIVE NEGATIVE   Leukocytes,Ua LARGE (A) NEGATIVE   RBC / HPF >50 (H) 0 - 5 RBC/hpf   WBC, UA >50 (H) 0 - 5 WBC/hpf   Bacteria, UA RARE (A) NONE SEEN   Squamous Epithelial / LPF 11-20 0 - 5   Mucus PRESENT   Wet prep, genital     Status: Abnormal   Collection Time: 07/25/20 10:06 PM   Specimen: PATH Cytology Cervicovaginal Ancillary Only  Result Value Ref Range   Yeast Wet Prep HPF POC NONE SEEN NONE SEEN   Trich, Wet Prep PRESENT (A) NONE SEEN   Clue Cells Wet Prep HPF POC PRESENT (A) NONE SEEN   WBC, Wet Prep HPF POC MANY (A) NONE SEEN   Sperm NONE SEEN   Pregnancy, urine POC     Status: Abnormal   Collection Time: 07/25/20 10:10 PM  Result Value Ref Range    Preg Test, Ur POSITIVE (A) NEGATIVE    O+ blood type  IMAGING US OB Comp Less 14 Wks  Result Date: 07/25/2020 CLINICAL DATA:  Vaginal bleeding for 1 day EXAM: OBSTETRIC <14 WK ULTRASOUND TECHNIQUE: Transabdominal ultrasound was performed for evaluation of the gestation as well as the maternal uterus and adnexal regions. COMPARISON:  None. FINDINGS: Intrauterine gestational sac: Single Yolk sac:  Not Visualized. Embryo:  Not Visualized. MSD:  3.6 mm   5 w   0 d Subchorionic hemorrhage:  None visualized. Maternal uterus/adnexae: Probable early intrauterine gestational sac as above. No uterine masses. Right ovary measures 3.3 x 2.1 x 2.4 cm and the left ovary measures 2.3 x 1.8 x 2.0 cm. No adnexal masses. No free fluid. IMPRESSION: 1. Probable early intrauterine gestational sac, but no yolk sac, fetal pole, or cardiac activity yet visualized. Mean sac diameter corresponding to an estimated age of 5 weeks and 2 days. Recommend follow-up quantitative B-HCG levels and follow-up US in 14 days to assess viability. This recommendation follows SRU consensus guidelines: Diagnostic Criteria for Nonviable Pregnancy Early in the First Trimester. Malva Limes Med 2013; 353:2992-42. Electronically Signed   By: Sharlet Salina M.D.   On: 07/25/2020 22:42     MAU Management/MDM: Ordered usual first trimester r/o ectopic labs.   Pelvic exam and cultures done Will check baseline Ultrasound to rule out ectopic.  This bleeding/pain can represent a normal pregnancy with bleeding, spontaneous abortion or even an ectopic which can be life-threatening.  The process as listed above helps to determine which of these is present.  DIscussed findings.  Recommend repeat HCG Sat evening and [place] next week  Or later DIscussed Trichomonal vaginitis.  Treated with Flagyl and Partner Rx given   ASSESSMENT Pregnancy at [redacted]w[redacted]d IUGS at 5 weeks with no YS or FP Bleeding  in early pregnancy, could be threatened abortion or cervicitis from  Trichomonas  PLAN Discharge home Plan to repeat HCG level in 48 hours  Will repeat  Ultrasound in about 7-10 days if HCG levels double appropriately  Ectopic precautions Partner Rx for trich, abstinence for 1-2 weeks  Pt stable at time of discharge. Encouraged to return here or to other Urgent Care/ED if she develops worsening of symptoms, increase in pain, fever, or other concerning symptoms.    Wynelle Bourgeois CNM, MSN Certified Nurse-Midwife 07/25/2020  10:07 PM

## 2020-07-25 NOTE — MAU Note (Addendum)
When I used BR tonight I noticed I was bleeding. I am [redacted]wks pregnant. LMP 06/12/20. No pain. No recent intercourse. Has first appt at [facility] on 08/14/20

## 2020-07-26 ENCOUNTER — Other Ambulatory Visit: Payer: Self-pay

## 2020-07-26 ENCOUNTER — Inpatient Hospital Stay (HOSPITAL_COMMUNITY): Payer: Medicaid Other

## 2020-07-26 ENCOUNTER — Encounter (HOSPITAL_COMMUNITY): Payer: Self-pay | Admitting: Obstetrics and Gynecology

## 2020-07-26 ENCOUNTER — Inpatient Hospital Stay (HOSPITAL_COMMUNITY)
Admission: AD | Admit: 2020-07-26 | Discharge: 2020-07-26 | Disposition: A | Discharge status: Home / Self Care | Payer: Medicaid Other | Attending: Obstetrics and Gynecology | Admitting: Obstetrics and Gynecology

## 2020-07-26 DIAGNOSIS — O039 Complete or unspecified spontaneous abortion without complication: Secondary | ICD-10-CM | POA: Insufficient documentation

## 2020-07-26 DIAGNOSIS — Z3A01 Less than 8 weeks gestation of pregnancy: Secondary | ICD-10-CM | POA: Insufficient documentation

## 2020-07-26 DIAGNOSIS — O209 Hemorrhage in early pregnancy, unspecified: Secondary | ICD-10-CM | POA: Diagnosis not present

## 2020-07-26 DIAGNOSIS — Z6790 Unspecified blood type, Rh positive: Secondary | ICD-10-CM

## 2020-07-26 LAB — CBC
HCT: 39.5 % (ref 36.0–46.0)
Hemoglobin: 13.1 g/dL (ref 12.0–15.0)
MCH: 30 pg (ref 26.0–34.0)
MCHC: 33.2 g/dL (ref 30.0–36.0)
MCV: 90.4 fL (ref 80.0–100.0)
Platelets: 222 10*3/uL (ref 150–400)
RBC: 4.37 MIL/uL (ref 3.87–5.11)
RDW: 12.4 % (ref 11.5–15.5)
WBC: 9 10*3/uL (ref 4.0–10.5)
nRBC: 0 % (ref 0.0–0.2)

## 2020-07-26 LAB — HCG, QUANTITATIVE, PREGNANCY: hCG, Beta Chain, Quant, S: 426 m[IU]/mL — ABNORMAL HIGH (ref <5)

## 2020-07-26 LAB — HIV ANTIBODY (ROUTINE TESTING W REFLEX): HIV Screen 4th Generation wRfx: NONREACTIVE

## 2020-07-26 NOTE — MAU Provider Note (Signed)
History     CSN: [id number]  Arrival date and time: 07/26/20 1213   First Provider Initiated Contact with Patient 07/26/20 1329      Chief Complaint  Patient presents with  . Vaginal Bleeding   24 y.o. G1 @[redacted]w[redacted]d  by LMP presenting for worsening VB. Was seen last night in MAU and showed IUGS only with qhcg of 625. Reports a large gush of blood this am and has soaked 2 pads since. She reported passing some tissue and brought a picture with her. Having some dizziness. Having mild uterine cramps. Rates 5/10. Hasn't taken anything for it.   OB History    Gravida  1   Para  0   Term  0   Preterm  0   AB  0   Living  0     SAB  0   TAB  0   Ectopic  0   Multiple  0   Live Births              Past Medical History:  Diagnosis Date  . Dysrhythmia   . Medical history non-contributory   . Migraines     Past Surgical History:  Procedure Laterality Date  . ADENOIDECTOMY W/ MYRINGOTOMY  5940   24 Years old   . TONSILLECTOMY  7119   24 Years old   . TYMPANOSTOMY TUBE PLACEMENT Bilateral 1999, 20069, 1465   90 weeks old, 24 year old and 24 years old   . WISDOM TOOTH EXTRACTION  3817   24 Years old     Family History  Problem Relation Age of Onset  . Migraines Mother   . Menstrual problems Mother   . Diabetes Mother   . Anxiety disorder Mother        xanax  . Depression Mother        prozac  . Thyroid disease Sister   . Diabetes Maternal Grandmother   . Dementia Maternal Grandmother        Died at 11  . Diabetes Maternal Grandfather   . Emphysema Maternal Grandfather        Died at 85  . Febrile seizures Brother        Died at the age of 16 years old  . Pneumonia Paternal Grandmother        Died at 52  . Heart failure Paternal Grandfather        Died at 56    Social History   Tobacco Use  . Smoking status: Passive Smoke Exposure - Never Smoker  . Smokeless tobacco: Never Used  . Tobacco comment: Mom smokes   Vaping Use  . Vaping Use: Never used   Substance Use Topics  . Alcohol use: Yes    Comment: occ  . Drug use: No    Allergies:  Allergies  Allergen Reactions  . Septra [Bactrim] Nausea And Vomiting  . Sulfa Antibiotics Anaphylaxis and Nausea And Vomiting  . Prednisone Other (See Comments)    Extreme crying  . Codeine Palpitations  . Other Itching, Swelling and Rash    TACO SEASONING (MARCUM BRAND FROM [employer])    No medications prior to admission.   Review of Systems  Gastrointestinal: Positive for abdominal pain.  Genitourinary: Positive for vaginal bleeding.  Neurological: Positive for dizziness. Negative for syncope.   Physical Exam   Blood pressure 129/74, pulse 92, temperature 98.1 F (36.7 C), temperature source Oral, resp. rate 18, height 5\' 4"  (1.626 m), weight 64.6 kg, last  menstrual period 06/12/2020, SpO2 100 %. Orthostatic VS for the past 24 hrs:  BP- Lying Pulse- Lying BP- Sitting Pulse- Sitting BP- Standing at 0 minutes Pulse- Standing at 0 minutes  07/26/20 1347 -- -- -- -- 105/62 141  07/26/20 1346 -- -- 115/73 106 -- --  07/26/20 1345 119/56 101 -- -- -- --   Physical Exam Vitals and nursing note reviewed. Exam conducted with a chaperone present.  Constitutional:      General: She is not in acute distress.    Appearance: Normal appearance.  HENT:     Head: Normocephalic and atraumatic.  Cardiovascular:     Rate and Rhythm: Normal rate.  Pulmonary:     Effort: Pulmonary effort is normal. No respiratory distress.  Genitourinary:    Comments: External: no lesions or erythema Vagina: rugated, pink, moist, scant bloody, cleared with 1 fox swab discharge Cervix: closed  Skin:    General: Skin is warm and dry.  Neurological:     General: No focal deficit present.     Mental Status: She is alert and oriented to person, place, and time.  Psychiatric:        Mood and Affect: Mood normal.    Results for orders placed or performed during the hospital encounter of 07/26/20 (from the  past 24 hour(s))  CBC     Status: None   Collection Time: 07/26/20  1:13 PM  Result Value Ref Range   WBC 9.0 4.0 - 10.5 K/uL   RBC 4.37 3.87 - 5.11 MIL/uL   Hemoglobin 13.1 12.0 - 15.0 g/dL   HCT 96.0 36 - 46 %   MCV 90.4 80.0 - 100.0 fL   MCH 30.0 26.0 - 34.0 pg   MCHC 33.2 30.0 - 36.0 g/dL   RDW 45.4 09.8 - 11.9 %   Platelets 222 150 - 400 K/uL   nRBC 0.0 0.0 - 0.2 %  hCG, quantitative, pregnancy     Status: Abnormal   Collection Time: 07/26/20  1:13 PM  Result Value Ref Range   hCG, Beta Chain, Quant, S 426 (H) <5 mIU/mL   US OB Comp Less 14 Wks  Result Date: 07/25/2020 CLINICAL DATA:  Vaginal bleeding for 1 day EXAM: OBSTETRIC <14 WK ULTRASOUND TECHNIQUE: Transabdominal ultrasound was performed for evaluation of the gestation as well as the maternal uterus and adnexal regions. COMPARISON:  None. FINDINGS: Intrauterine gestational sac: Single Yolk sac:  Not Visualized. Embryo:  Not Visualized. MSD:  3.6 mm   5 w   0 d Subchorionic hemorrhage:  None visualized. Maternal uterus/adnexae: Probable early intrauterine gestational sac as above. No uterine masses. Right ovary measures 3.3 x 2.1 x 2.4 cm and the left ovary measures 2.3 x 1.8 x 2.0 cm. No adnexal masses. No free fluid. IMPRESSION: 1. Probable early intrauterine gestational sac, but no yolk sac, fetal pole, or cardiac activity yet visualized. Mean sac diameter corresponding to an estimated age of 5 weeks and 2 days. Recommend follow-up quantitative B-HCG levels and follow-up US in 14 days to assess viability. This recommendation follows SRU consensus guidelines: Diagnostic Criteria for Nonviable Pregnancy Early in the First Trimester. Malva Limes Med 2013; 147:8295-62. Electronically Signed   By: Sharlet Salina M.D.   On: 07/25/2020 22:42   US OB Transvaginal  Result Date: 07/26/2020 CLINICAL DATA:  Vaginal bleeding. Patient reportedly 6 weeks and 2 days pregnant based on her last menstrual period. Beta HCG level was 625 yesterday.  EXAM: TRANSVAGINAL OB ULTRASOUND TECHNIQUE:  Transvaginal ultrasound was performed for complete evaluation of the gestation as well as the maternal uterus, adnexal regions, and pelvic cul-de-sac. COMPARISON:  None. FINDINGS: Intrauterine gestational sac: None Yolk sac:  Not Visualized. Embryo:  Not Visualized. Cardiac Activity: Not applicable Maternal uterus/adnexae: Uterus normal in size. No uterine masses. Normal cervix. Endometrium is heterogeneous measuring approximately 11 mm in thickness. Normal ovaries.  No adnexal masses.  No pelvic free fluid. IMPRESSION: 1. No visualized intrauterine or ectopic pregnancy. 2. Heterogeneous and prominent endometrium, which may reflect an early intrauterine pregnancy or a completed miscarriage. Recommend follow-up beta HCG levels, and if rising, repeat pelvic ultrasound to assess for normal pregnancy progression. Electronically Signed   By: Amie Portland M.D.   On: 07/26/2020 14:58   MAU Course  Procedures  MDM Reviewed pic on patients phone, has appearance of gestational sac. Labs and [place] ordered and reviewed. SAB confirmed. Pt informed, condolences given. Recommend f/u in office in 2 weeks. Stable for discharge home.   Assessment and Plan   1. SAB (spontaneous abortion)   2. Blood type, Rh positive    Discharge home Follow up at [facility] in 2 weeks- Daniella, CNM notified Pelvic rest Return precautions  Allergies as of 07/26/2020      Reactions   Septra [bactrim] Nausea And Vomiting   Sulfa Antibiotics Anaphylaxis, Nausea And Vomiting   Prednisone Other (See Comments)   Extreme crying   Codeine Palpitations   Other Itching, Swelling, Rash   TACO SEASONING (MARCUM BRAND FROM [employer])      Medication List    STOP taking these medications   benzonatate 100 MG capsule Commonly known as: TESSALON   pseudoephedrine 60 MG tablet Commonly known as: SUDAFED     TAKE these medications   cetirizine 10 MG tablet Commonly known as: ZyrTEC  Allergy Take 1 tablet (10 mg total) by mouth daily.   prenatal multivitamin Tabs tablet Take 1 tablet by mouth daily at 12 noon.   promethazine-dextromethorphan 6.25-15 MG/5ML syrup Commonly known as: PROMETHAZINE-DM Take 5 mLs by mouth at bedtime as needed for cough.       Donette Larry, CNM 07/26/2020, 4:01 PM

## 2020-07-26 NOTE — MAU Note (Addendum)
Presents with c/o VB, states "pouring blood"/  Seen last night in MAU for same but VB worse today.  Also reports abdominal cramping.

## 2020-07-26 NOTE — Discharge Instructions (Signed)
Miscarriage A miscarriage is the loss of an unborn baby (fetus) before the 20th week of pregnancy. Follow these instructions at home: Medicines   Take over-the-counter and prescription medicines only as told by your doctor.  If you were prescribed antibiotic medicine, take it as told by your doctor. Do not stop taking the antibiotic even if you start to feel better.  Do not take NSAIDs unless your doctor says that this is safe for you. NSAIDs include aspirin and ibuprofen. These medicines can cause bleeding. Activity  Rest as directed. Ask your doctor what activities are safe for you.  Have someone help you at home during this time. General instructions  Write down how many pads you use each day and how soaked they are.  Watch the amount of tissue or clumps of blood (blood clots) that you pass from your vagina. Save any large amounts of tissue for your doctor.  Do not use tampons, douche, or have sex until your doctor approves.  To help you and your partner with the process of grieving, talk with your doctor or seek counseling.  When you are ready, meet with your doctor to talk about steps you should take for your health. Also, talk with your doctor about steps to take to have a healthy pregnancy in the future.  Keep all follow-up visits as told by your doctor. This is important. Contact a doctor if:  You have a fever or chills.  You have vaginal discharge that smells bad.  You have more bleeding. Get help right away if:  You have very bad cramps or pain in your back or belly.  You pass clumps of blood that are walnut-sized or larger from your vagina.  You pass tissue that is walnut-sized or larger from your vagina.  You soak more than 1 regular pad in an hour.  You get light-headed or weak.  You faint (pass out).  You have feelings of sadness that do not go away, or you have thoughts of hurting yourself. Summary  A miscarriage is the loss of an unborn baby before  the 20th week of pregnancy.  Follow your doctor's instructions for home care. Keep all follow-up appointments.  To help you and your partner with the process of grieving, talk with your doctor or seek counseling. This information is not intended to replace advice given to you by your health care provider. Make sure you discuss any questions you have with your health care provider. Document Revised: 02/17/2019 Document Reviewed: 12/01/2016 Elsevier Patient Education  2020 Elsevier Inc.  

## 2020-07-29 LAB — GC/CHLAMYDIA PROBE AMP (~~LOC~~) NOT AT ARMC
Chlamydia: NEGATIVE
Comment: NEGATIVE
Comment: NORMAL
Neisseria Gonorrhea: NEGATIVE

## 2020-08-01 DIAGNOSIS — O021 Missed abortion: Secondary | ICD-10-CM | POA: Diagnosis not present

## 2020-08-01 DIAGNOSIS — A599 Trichomoniasis, unspecified: Secondary | ICD-10-CM | POA: Diagnosis not present

## 2020-08-01 DIAGNOSIS — O039 Complete or unspecified spontaneous abortion without complication: Secondary | ICD-10-CM | POA: Diagnosis not present

## 2020-08-09 DIAGNOSIS — O021 Missed abortion: Secondary | ICD-10-CM | POA: Diagnosis not present

## 2020-08-19 ENCOUNTER — Ambulatory Visit: Payer: Medicaid Other | Attending: Internal Medicine | Admitting: Internal Medicine

## 2020-08-19 ENCOUNTER — Other Ambulatory Visit: Payer: Self-pay

## 2020-08-19 DIAGNOSIS — Z7689 Persons encountering health services in other specified circumstances: Secondary | ICD-10-CM | POA: Diagnosis not present

## 2020-08-19 DIAGNOSIS — Z2821 Immunization not carried out because of patient refusal: Secondary | ICD-10-CM | POA: Diagnosis not present

## 2020-08-19 NOTE — Progress Notes (Signed)
Virtual Visit via Telephone Note Due to current restrictions/limitations of in-office visits due to the COVID-19 pandemic, this scheduled clinical appointment was converted to a telehealth visit  I connected with Lauren [name] on 08/19/20 at 9:24 a.m by telephone and verified that I am speaking with the correct person using two identifiers. I am in my office.  The patient is at home.  Only the patient and myself participated in this encounter.  I discussed the limitations, risks, security and privacy concerns of performing an evaluation and management service by telephone and the availability of in person appointments. I also discussed with the patient that there may be a patient responsible charge related to this service. The patient expressed understanding and agreed to proceed.   History of Present Illness: Pt with hx of migraines. Pt wanting to establish primary care with [place].  Previous PCP was [facility]. Not on any medications.  Hx of chronic migraines.   Nexplanon removed because it caused gall stones.  Had miscarriage on 07/26/2020.  Not planning to get pregnant again soon but if it happens it happens.  Not wanting to be placed on any other BC at this time.  Does not use condoms.  Active with one partner.    HM:  Declines flu shot.  Does not plan to get COVID vaccine Outpatient Encounter Medications as of 08/19/2020  Medication Sig  . cetirizine (ZYRTEC ALLERGY) 10 MG tablet Take 1 tablet (10 mg total) by mouth daily. (Patient not taking: Reported on 08/19/2020)  . Prenatal Vit-Fe Fumarate-FA (PRENATAL MULTIVITAMIN) TABS tablet Take 1 tablet by mouth daily at 12 noon. (Patient not taking: Reported on 08/19/2020)  . promethazine-dextromethorphan (PROMETHAZINE-DM) 6.25-15 MG/5ML syrup Take 5 mLs by mouth at bedtime as needed for cough. (Patient not taking: Reported on 08/19/2020)   No facility-administered encounter medications on file as of 08/19/2020.       Observations/Objective: No direct observation done as this was a telephone encounter.  Assessment and Plan: 1. Encounter to establish care -Recommend that we do an in person visit for her well woman exam.  Patient is agreeable to this.  2. Influenza vaccination declined This was offered.  Patient declined.  3. COVID-19 vaccination declined Discussed patient's concerns about getting COVID-19 vaccine.  I went over some of the side effects of the moderna and [employer] vaccines.  Encouraged her to get vaccinated to protect herself in the community.  Patient declined.   Follow Up Instructions: 2 mths for well woman visit and pap   I discussed the assessment and treatment plan with the patient. The patient was provided an opportunity to ask questions and all were answered. The patient agreed with the plan and demonstrated an understanding of the instructions.   The patient was advised to call back or seek an in-person evaluation if the symptoms worsen or if the condition fails to improve as anticipated.  I provided 10 minutes of non-face-to-face time during this encounter.   Jonah Blue, MD

## 2020-09-24 ENCOUNTER — Other Ambulatory Visit: Payer: Self-pay

## 2020-09-24 ENCOUNTER — Ambulatory Visit (HOSPITAL_COMMUNITY)
Admission: EM | Admit: 2020-09-24 | Discharge: 2020-09-24 | Disposition: A | Discharge status: Home / Self Care | Payer: Medicaid Other | Attending: Family Medicine | Admitting: Family Medicine

## 2020-09-24 ENCOUNTER — Encounter (HOSPITAL_COMMUNITY): Payer: Self-pay

## 2020-09-24 DIAGNOSIS — Z20822 Contact with and (suspected) exposure to covid-19: Secondary | ICD-10-CM | POA: Diagnosis not present

## 2020-09-24 DIAGNOSIS — J0110 Acute frontal sinusitis, unspecified: Secondary | ICD-10-CM | POA: Diagnosis not present

## 2020-09-24 MED ORDER — AMOXICILLIN-POT CLAVULANATE 875-125 MG PO TABS: 1.0000 | ORAL_TABLET | Freq: Two times a day (BID) | ORAL | 0 refills | Status: DC

## 2020-09-24 NOTE — ED Provider Notes (Signed)
[facility]    CSN: [id number] Arrival date & time: 09/24/20  1125      History   Chief Complaint Chief Complaint  Patient presents with  . Headache  . Cough    HPI Lauren [name] is a 24 y.o. female.   Presenting today with over a week of sinus pain and pressure, frontal headache, nasal congestion, post nasal drainage, cough. Denies fever, chills, CP, SOB, abdominal pain, N/V/D. Taking mucinex, OTC cold and congestion medications without benefit. States she was getting better a few days ago but yesterday sxs all got much worse again. Hx of allergic rhinitis on daily antihistamines consistently.       Past Medical History:  Diagnosis Date  . Dysrhythmia   . Medical history non-contributory   . Migraines     Patient Active Problem List   Diagnosis Date Noted  . Migraine without aura and without status migrainosus, not intractable 02/26/2015  . Episodic tension-type headache, not intractable 02/26/2015  . Poor sleep hygiene 02/26/2015  . Presence of subdermal contraceptive implant 03/08/2014  . Irregular menses 03/08/2014  . Acne 03/08/2014  . Chronic headaches 10/28/2013  . 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 Problems with learning 09/21/2013    Past Surgical History:  Procedure Laterality Date  . ADENOIDECTOMY W/ MYRINGOTOMY  10164   24 Years old   . TONSILLECTOMY  4342   24 Years old   . TYMPANOSTOMY TUBE PLACEMENT Bilateral 1999, 20075, 2260   24 weeks old, 24 year old and 24 years old   . WISDOM TOOTH EXTRACTION  856   24 Years old     OB History    Gravida  1   Para  0   Term  0   Preterm  0   AB  0   Living  0     SAB  0   TAB  0   Ectopic  0   Multiple  0   Live Births               Home Medications    Prior to Admission medications   Medication Sig Start Date End Date Taking? Authorizing Provider  cetirizine (ZYRTEC ALLERGY) 10 MG tablet  Take 1 tablet (10 mg total) by mouth daily. 06/21/20  Yes Wallis Bamberg, PA-C  amoxicillin-clavulanate (AUGMENTIN) 875-125 MG tablet Take 1 tablet by mouth every 12 (twelve) hours. 09/24/20   Particia Nearing, PA-C  Prenatal Vit-Fe Fumarate-FA (PRENATAL MULTIVITAMIN) TABS tablet Take 1 tablet by mouth daily at 12 noon.     [provider]  promethazine-dextromethorphan (PROMETHAZINE-DM) 6.25-15 MG/5ML syrup Take 5 mLs by mouth at bedtime as needed for cough. Patient not taking: Reported on 08/19/2020 06/21/20   Wallis Bamberg, PA-C    Family History Family History  Problem Relation Age of Onset  . Migraines Mother   . Menstrual problems Mother   . Diabetes Mother   . Anxiety disorder Mother        xanax  . Depression Mother        prozac  . Thyroid disease Sister   . Diabetes Maternal Grandmother   . Dementia Maternal Grandmother        Died at 56  . Diabetes Maternal Grandfather   . Emphysema Maternal Grandfather        Died at 54  . Febrile seizures Brother        Died at the age  of 97 years old  . Pneumonia Paternal Grandmother        Died at 62  . Heart failure Paternal Grandfather        Died at 59    Social History Social History   Tobacco Use  . Smoking status: Passive Smoke Exposure - Never Smoker  . Smokeless tobacco: Never Used  . Tobacco comment: Mom smokes   Vaping Use  . Vaping Use: Never used  Substance Use Topics  . Alcohol use: Not Currently    Comment: occ  . Drug use: No     Allergies   Septra [bactrim], Sulfa antibiotics, Prednisone, Codeine, and Other   Review of Systems Review of Systems PER HPI    Physical Exam Triage Vital Signs ED Triage Vitals  Enc Vitals Group     BP 09/24/20 1206 112/80     Pulse Rate 09/24/20 1206 65     Resp 09/24/20 1206 18     Temp 09/24/20 1206 97.8 F (36.6 C)     Temp Source 09/24/20 1206 Oral     SpO2 09/24/20 1206 98 %     Weight --      Height --      Head Circumference --      Peak  Flow --      Pain Score 09/24/20 1207 8     Pain Loc --      Pain Edu? --      Excl. in GC? --    No data found.  Updated Vital Signs BP 112/80   Pulse 65   Temp 97.8 F (36.6 C) (Oral)   Resp 18   LMP 09/21/2020   SpO2 98%   Breastfeeding Unknown   Visual Acuity Right Eye Distance:   Left Eye Distance:   Bilateral Distance:    Right Eye Near:   Left Eye Near:    Bilateral Near:     Physical Exam Vitals and nursing note reviewed.  Constitutional:      Appearance: Normal appearance. She is not ill-appearing.  HENT:     Head: Atraumatic.     Comments: Right frontal sinus ttp    Right Ear: Tympanic membrane normal.     Left Ear: Tympanic membrane normal.     Nose: Congestion present.     Mouth/Throat:     Mouth: Mucous membranes are moist.     Pharynx: Posterior oropharyngeal erythema present.  Eyes:     Extraocular Movements: Extraocular movements intact.     Conjunctiva/sclera: Conjunctivae normal.  Cardiovascular:     Rate and Rhythm: Normal rate and regular rhythm.     Heart sounds: Normal heart sounds.  Pulmonary:     Effort: Pulmonary effort is normal.     Breath sounds: Normal breath sounds. No wheezing or rales.  Abdominal:     General: Bowel sounds are normal. There is no distension.     Palpations: Abdomen is soft.     Tenderness: There is no abdominal tenderness. There is no guarding.  Musculoskeletal:        General: Normal range of motion.     Cervical back: Normal range of motion and neck supple.  Skin:    General: Skin is warm and dry.  Neurological:     Mental Status: She is alert and oriented to person, place, and time.  Psychiatric:        Mood and Affect: Mood normal.        Thought Content: Thought content  normal.        Judgment: Judgment normal.      UC Treatments / Results  Labs (all labs ordered are listed, but only abnormal results are displayed) Labs Reviewed  SARS CORONAVIRUS 2 (TAT 6-24 HRS)    EKG   Radiology No  results found.  Procedures Procedures (including critical care time)  Medications Ordered in UC Medications - No data to display  Initial Impression / Assessment and Plan / UC Course  I have reviewed the triage vital signs and the nursing notes.  Pertinent labs & imaging results that were available during my care of the patient were reviewed by me and considered in my medical decision making (see chart for details).     COVID pcr pending as her work requires it to return. Given duration, severity and exam findings will treat with augmentin for sinusitis, discussed neti pot, flonase, and other supportive care. Return if sxs worsen or fail to improve. Continue allergy regimen.   Final Clinical Impressions(s) / UC Diagnoses   Final diagnoses:  Acute frontal sinusitis, recurrence not specified   Discharge Instructions   None    ED Prescriptions    Medication Sig Dispense Auth. Provider   amoxicillin-clavulanate (AUGMENTIN) 875-125 MG tablet Take 1 tablet by mouth every 12 (twelve) hours. 14 tablet Particia Nearing, [place]     PDMP not reviewed this encounter.   Particia Nearing, [place] 09/24/20 [phone]

## 2020-09-24 NOTE — ED Triage Notes (Signed)
States that she has been coughing and sneezing for the last week and two days ago developed a headache. 8/10 pain. Tylenol taken with no relief last night.

## 2020-09-25 LAB — SARS CORONAVIRUS 2 (TAT 6-24 HRS): SARS Coronavirus 2: NEGATIVE

## 2020-10-21 ENCOUNTER — Encounter: Payer: Medicaid Other | Admitting: Internal Medicine

## 2020-10-30 DIAGNOSIS — N760 Acute vaginitis: Secondary | ICD-10-CM | POA: Diagnosis not present

## 2020-10-30 DIAGNOSIS — A599 Trichomoniasis, unspecified: Secondary | ICD-10-CM | POA: Diagnosis not present

## 2020-11-01 ENCOUNTER — Encounter (HOSPITAL_COMMUNITY): Payer: Self-pay

## 2020-11-01 ENCOUNTER — Other Ambulatory Visit: Payer: Self-pay

## 2020-11-01 ENCOUNTER — Ambulatory Visit (HOSPITAL_COMMUNITY)
Admission: EM | Admit: 2020-11-01 | Discharge: 2020-11-01 | Disposition: A | Discharge status: Home / Self Care | Payer: Medicaid Other | Attending: Urgent Care | Admitting: Urgent Care

## 2020-11-01 DIAGNOSIS — Z20822 Contact with and (suspected) exposure to covid-19: Secondary | ICD-10-CM | POA: Insufficient documentation

## 2020-11-01 DIAGNOSIS — J069 Acute upper respiratory infection, unspecified: Secondary | ICD-10-CM | POA: Diagnosis not present

## 2020-11-01 LAB — SARS CORONAVIRUS 2 (TAT 6-24 HRS): SARS Coronavirus 2: NEGATIVE

## 2020-11-01 MED ORDER — PROMETHAZINE-DM 6.25-15 MG/5ML PO SYRP: 5.0000 mL | ORAL_SOLUTION | Freq: Every evening | ORAL | 0 refills | Status: DC | PRN

## 2020-11-01 MED ORDER — BENZONATATE 100 MG PO CAPS: 100.0000 mg | ORAL_CAPSULE | Freq: Three times a day (TID) | ORAL | 0 refills | Status: DC | PRN

## 2020-11-01 MED ORDER — CETIRIZINE HCL 10 MG PO TABS: 10.0000 mg | ORAL_TABLET | Freq: Every day | ORAL | 0 refills | Status: DC

## 2020-11-01 MED ORDER — PSEUDOEPHEDRINE HCL 30 MG PO TABS: 30.0000 mg | ORAL_TABLET | Freq: Three times a day (TID) | ORAL | 0 refills | Status: DC | PRN

## 2020-11-01 NOTE — ED Provider Notes (Signed)
[facility]   MRN: [id number] DOB: 13-Jul-1996  Subjective:   Lauren [name] is a 24 y.o. female presenting for 3-day history of acute onset dry cough, headache, sneezing, sinus headaches.  Patient had exposure to a friend that tested positive for Covid.  Denies chest pain, shortness of breath, fevers, body aches.  She has not had any vaccinations, is not COVID vaccinated.  Wants to make sure that she does not have Covid before she gets together with her family for the holidays.  No current facility-administered medications for this encounter.  Current Outpatient Medications:  .  amoxicillin-clavulanate (AUGMENTIN) 875-125 MG tablet, Take 1 tablet by mouth every 12 (twelve) hours., Disp: 14 tablet, Rfl: 0 .  cetirizine (ZYRTEC ALLERGY) 10 MG tablet, Take 1 tablet (10 mg total) by mouth daily., Disp: 30 tablet, Rfl: 0 .  Prenatal Vit-Fe Fumarate-FA (PRENATAL MULTIVITAMIN) TABS tablet, Take 1 tablet by mouth daily at 12 noon. , Disp: , Rfl:  .  promethazine-dextromethorphan (PROMETHAZINE-DM) 6.25-15 MG/5ML syrup, Take 5 mLs by mouth at bedtime as needed for cough. (Patient not taking: No sig reported), Disp: 100 mL, Rfl: 0   Allergies  Allergen Reactions  . Codeine Palpitations and Other (See Comments)    Speeds up heart rate  . Prednisone Other (See Comments)    Extreme crying Back breaks out   . Septra [Bactrim] Nausea And Vomiting  . Sulfa Antibiotics Anaphylaxis and Nausea And Vomiting  . Other Itching, Swelling and Rash    TACO SEASONING (MARCUM BRAND FROM [employer])    Past Medical History:  Diagnosis Date  . Dysrhythmia   . Medical history non-contributory   . Migraines      Past Surgical History:  Procedure Laterality Date  . ADENOIDECTOMY W/ MYRINGOTOMY  2073   24 Years old   . TONSILLECTOMY  4411   24 Years old   . TYMPANOSTOMY TUBE PLACEMENT Bilateral 1999, 20034, 6554   52 weeks old, 24 year old and 24 years old   . WISDOM TOOTH EXTRACTION  2249    24 Years old     Family History  Problem Relation Age of Onset  . Migraines Mother   . Menstrual problems Mother   . Diabetes Mother   . Anxiety disorder Mother        xanax  . Depression Mother        prozac  . Thyroid disease Sister   . Diabetes Maternal Grandmother   . Dementia Maternal Grandmother        Died at 39  . Diabetes Maternal Grandfather   . Emphysema Maternal Grandfather        Died at 74  . Febrile seizures Brother        Died at the age of 16 years old  . Pneumonia Paternal Grandmother        Died at 37  . Heart failure Paternal Grandfather        Died at 41    Social History   Tobacco Use  . Smoking status: Passive Smoke Exposure - Never Smoker  . Smokeless tobacco: Never Used  . Tobacco comment: Mom smokes   Vaping Use  . Vaping Use: Never used  Substance Use Topics  . Alcohol use: Not Currently    Comment: occ  . Drug use: No    ROS   Objective:   Vitals: BP 111/72 (BP Location: Right Arm)   Pulse 94   Temp 99.1 F (37.3 C) (  Oral)   Resp 16   LMP 10/21/2020   SpO2 97%   Physical Exam Constitutional:      General: She is not in acute distress.    Appearance: Normal appearance. She is well-developed. She is not ill-appearing, toxic-appearing or diaphoretic.  HENT:     Head: Normocephalic and atraumatic.     Nose: Nose normal.     Mouth/Throat:     Mouth: Mucous membranes are moist.  Eyes:     Extraocular Movements: Extraocular movements intact.     Pupils: Pupils are equal, round, and reactive to light.  Cardiovascular:     Rate and Rhythm: Normal rate and regular rhythm.     Pulses: Normal pulses.     Heart sounds: Normal heart sounds. No murmur heard. No friction rub. No gallop.   Pulmonary:     Effort: Pulmonary effort is normal. No respiratory distress.     Breath sounds: Normal breath sounds. No stridor. No wheezing, rhonchi or rales.  Skin:    General: Skin is warm and dry.     Findings: No rash.  Neurological:      Mental Status: She is alert and oriented to person, place, and time.  Psychiatric:        Mood and Affect: Mood normal.        Behavior: Behavior normal.        Thought Content: Thought content normal.       Assessment and Plan :   PDMP not reviewed this encounter.  1. Viral URI with cough     Will manage for viral illness such as viral URI, viral syndrome, viral rhinitis, COVID-19. Counseled patient on nature of COVID-19 including modes of transmission, diagnostic testing, management and supportive care.  Offered scripts for symptomatic relief. COVID 19 testing is pending. Counseled patient on potential for adverse effects with medications prescribed/recommended today, ER and return-to-clinic precautions discussed, patient verbalized understanding.     Wallis Bamberg, [place] 11/01/20 1607

## 2020-11-01 NOTE — ED Triage Notes (Signed)
Pt c/o non-productive cough, HA, sneezing for approx 3 days. Pt states she was exposed to a COVID positive friend last week (friend was tested yesterday).  Denies SOB, sore throat, fever, congestion, runny nose, body aches, n/v/d.  Denies h/o COVID infection, no vaccines. Last took ibuprofen at approx 2200 last night.

## 2020-11-01 NOTE — Discharge Instructions (Signed)

## 2020-11-25 ENCOUNTER — Telehealth: Payer: Self-pay | Admitting: Internal Medicine

## 2020-11-25 ENCOUNTER — Encounter: Payer: Medicaid Other | Admitting: Internal Medicine

## 2020-11-25 NOTE — Telephone Encounter (Signed)
Called patient and LVM letting her know that due to inclement weather the office is closed today, her appointment has been cancelled, and needs to be rescheduled. Advised patient to call back [phone] to reschedule.

## 2020-12-18 ENCOUNTER — Other Ambulatory Visit: Payer: Self-pay

## 2020-12-18 ENCOUNTER — Ambulatory Visit (HOSPITAL_COMMUNITY): Payer: Medicaid Other | Admitting: Professional

## 2021-01-06 ENCOUNTER — Ambulatory Visit: Payer: Medicaid Other | Attending: Internal Medicine | Admitting: Internal Medicine

## 2021-01-06 ENCOUNTER — Other Ambulatory Visit (HOSPITAL_COMMUNITY)
Admission: RE | Admit: 2021-01-06 | Discharge: 2021-01-06 | Disposition: A | Discharge status: Home / Self Care | Payer: Medicaid Other | Source: Ambulatory Visit | Attending: Internal Medicine | Admitting: Internal Medicine

## 2021-01-06 ENCOUNTER — Encounter: Payer: Self-pay | Admitting: Internal Medicine

## 2021-01-06 ENCOUNTER — Ambulatory Visit (INDEPENDENT_AMBULATORY_CARE_PROVIDER_SITE_OTHER): Payer: Medicaid Other | Admitting: Professional

## 2021-01-06 ENCOUNTER — Other Ambulatory Visit: Payer: Self-pay

## 2021-01-06 ENCOUNTER — Telehealth (HOSPITAL_COMMUNITY): Payer: Self-pay | Admitting: Professional

## 2021-01-06 VITALS — BP 116/75 | HR 85 | Ht 64.0 in | Wt 142.0 lb

## 2021-01-06 DIAGNOSIS — E663 Overweight: Secondary | ICD-10-CM | POA: Diagnosis not present

## 2021-01-06 DIAGNOSIS — F411 Generalized anxiety disorder: Secondary | ICD-10-CM | POA: Diagnosis not present

## 2021-01-06 DIAGNOSIS — F419 Anxiety disorder, unspecified: Secondary | ICD-10-CM | POA: Insufficient documentation

## 2021-01-06 DIAGNOSIS — F32A Depression, unspecified: Secondary | ICD-10-CM

## 2021-01-06 DIAGNOSIS — N644 Mastodynia: Secondary | ICD-10-CM | POA: Insufficient documentation

## 2021-01-06 DIAGNOSIS — Z0000 Encounter for general adult medical examination without abnormal findings: Secondary | ICD-10-CM

## 2021-01-06 DIAGNOSIS — F331 Major depressive disorder, recurrent, moderate: Secondary | ICD-10-CM

## 2021-01-06 DIAGNOSIS — Z87891 Personal history of nicotine dependence: Secondary | ICD-10-CM | POA: Insufficient documentation

## 2021-01-06 DIAGNOSIS — H5213 Myopia, bilateral: Secondary | ICD-10-CM | POA: Insufficient documentation

## 2021-01-06 DIAGNOSIS — Z2821 Immunization not carried out because of patient refusal: Secondary | ICD-10-CM

## 2021-01-06 DIAGNOSIS — Z124 Encounter for screening for malignant neoplasm of cervix: Secondary | ICD-10-CM | POA: Insufficient documentation

## 2021-01-06 DIAGNOSIS — Z6825 Body mass index (BMI) 25.0-25.9, adult: Secondary | ICD-10-CM | POA: Diagnosis not present

## 2021-01-06 DIAGNOSIS — Z720 Tobacco use: Secondary | ICD-10-CM

## 2021-01-06 DIAGNOSIS — R002 Palpitations: Secondary | ICD-10-CM | POA: Insufficient documentation

## 2021-01-06 NOTE — Telephone Encounter (Signed)
See call log 

## 2021-01-06 NOTE — Progress Notes (Signed)
Virtual Visit via Telephone Note  I connected with Lauren [name] on 01/06/21 at  3:00 PM EST by telephone and verified that I am speaking with the correct person using two identifiers.  Location: Patient: home Provider: clinical home office   I discussed the limitations, risks, security and privacy concerns of performing an evaluation and management service by telephone and the availability of in person appointments. I also discussed with the patient that there may be a patient responsible charge related to this service. The patient expressed understanding and agreed to proceed.   Follow Up Instructions:    I discussed the assessment and treatment plan with the patient. The patient was provided an opportunity to ask questions and all were answered. The patient agreed with the plan and demonstrated an understanding of the instructions.   The patient was advised to call back or seek an in-person evaluation if the symptoms worsen or if the condition fails to improve as anticipated.  I provided 45 minutes of non-face-to-face time during this encounter.   Quinn AxeWhitney J Marcianna Daily, [facility]     Comprehensive Clinical Assessment (CCA) Note  01/06/2021 Lauren [name] [id number]  Chief Complaint:  Chief Complaint  Patient presents with  . Anxiety  . Depression   Visit Diagnosis: MDD; GAD    CCA Screening, Triage and Referral (STR)  Patient Reported Information How did you hear about us? Self  Referral name: No data recorded Referral phone number: No data recorded  Whom do you see for routine medical problems? Primary Care  [facility]  Practice/Facility Phone Number: No data recorded Name of Contact: No data recorded Contact Number: No data recorded Contact Fax Number: No data recorded Prescriber Name: No data recorded Prescriber Address (if known): No data recorded  What Is the Reason for Your Visit/Call Today? depression and  anxiety  How Long Has This Been Causing You Problems? > than 6 months  What Do You Feel Would Help You the Most Today? Medication   Have You Recently Been in Any Inpatient Treatment (Hospital/Detox/Crisis Center/28-Day Program)? No  Name/Location of Program/Hospital:No data recorded How Long Were You There? No data recorded When Were You Discharged? No data recorded  Have You [facility] Re[facility]? Yes  Who Do You [facility]? No data recorded  Have You Recently Had Any Thoughts About Hurting Yourself? No  Are You Planning to Commit Suicide/Harm Yourself At This time? No   Have you Recently Had Thoughts About Hurting Someone Karolee Ohslse? No  Explanation: No data recorded  Have You Used Any Alcohol or Drugs in the Past 24 Hours? Yes  How Long Ago Did You Use Drugs or Alcohol? 1600  What Did You Use and How Much? drinking alcohol; 1 drink when went out to eat   Do You Currently Have a Therapist/Psychiatrist? No  Name of Therapist/Psychiatrist: No data recorded  Have You Been Recently Discharged From Any Office Practice or Programs? No  Explanation of Discharge From Practice/Program: No data recorded    CCA Screening Triage Referral Assessment Type of Contact: Tele-Assessment  Is this Initial or Reassessment? Initial Assessment  Date Telepsych consult ordered in [facility]:  No data recorded Time Telepsych consult ordered in [facility]:  No data recorded  Patient Reported Information Reviewed? No data recorded Patient Left Without Being Seen? No data recorded Reason for Not Completing Assessment: No data recorded  Collateral Involvement: No data recorded  Does Patient Have a Automotive engineerCourt Appointed Legal  Guardian? No data recorded Name and Contact of Legal Guardian: No data recorded If Minor and Not Living with Parent(s), Who has Custody? No data recorded Is CPS involved or ever been involved? Never  Is APS involved or ever been involved?  Never   Patient Determined To Be At Risk for Harm To Self or Others Based on Review of Patient Reported Information or Presenting Complaint? No  Method: No data recorded Availability of Means: No data recorded Intent: No data recorded Notification Required: No data recorded Additional Information for Danger to Others Potential: No data recorded Additional Comments for Danger to Others Potential: No data recorded Are There Guns or Other Weapons in Your Home? No data recorded Types of Guns/Weapons: No data recorded Are These Weapons Safely Secured?                            No data recorded Who Could Verify You Are Able To Have These Secured: No data recorded Do You Have any Outstanding Charges, Pending Court Dates, Parole/Probation? No data recorded Contacted To Inform of Risk of Harm To Self or Others: No data recorded  Location of Assessment: No data recorded  Does Patient Present under Involuntary Commitment? No  IVC Papers Initial File Date: No data recorded  [place] of Residence: [place]   Patient Currently Receiving the Following Services: Not Receiving Services   Determination of Need: Routine (7 days)   Options For Referral: Medication Management     CCA Biopsychosocial Intake/Chief Complaint:  Pt reports she is looking for medication management for depression and anxiety. Pt reports she drinks a lot when feeling depressed- "I know I'm getting drunk and I still drink on the weekends when I'm off." Pt reports stressors as ruminations. Pt reports she got dx with depression and anxiety in middle school/high school; pt did not take meds at the time due to being on migraine meds that docs told would interact with. Pt reports having COVID a month ago. Pt reports "I used to get depressed and cry all the time but now I just get angry and stay mad all the time." Pt reports she is living back and forth at moms and boyfriends. Pt denies SI/HI/AVH. Pt reports "I want to take a  break for like 5 days but I don't want to die at all." Pt reports a friend recently committed suicide and has motivated her to get help. Pt reports father is Bipolar; brother depression.  Current Symptoms/Problems: depression, anxiety, ADHD   Patient Reported Schizophrenia/Schizoaffective Diagnosis in Past: No   Strengths: understands treamtnet options  Preferences: medication management  Abilities: can attend and participate in treatment   Type of Services Patient Feels are Needed: medication management   Initial Clinical Notes/Concerns: No data recorded  Mental Health Symptoms Depression:  Increase/decrease in appetite; Sleep (too much or little); Change in energy/activity; Hopelessness; Worthlessness; Fatigue; Irritability; Difficulty Concentrating   Duration of Depressive symptoms: Greater than two weeks   Mania:  None   Anxiety:   Difficulty concentrating; Fatigue; Irritability; Sleep; Worrying   Psychosis:  None   Duration of Psychotic symptoms: No data recorded  Trauma:  None   Obsessions:  None   Compulsions:  None   Inattention:  None   Hyperactivity/Impulsivity:  N/A   Oppositional/Defiant Behaviors:  None   Emotional Irregularity:  None   Other Mood/Personality Symptoms:  No data recorded   Mental Status Exam Appearance and self-care  Stature:  -- (  done via phone)   Weight:  No data recorded  Clothing:  No data recorded  Grooming:  No data recorded  Cosmetic use:  No data recorded  Posture/gait:  No data recorded  Motor activity:  No data recorded  Sensorium  Attention:  Normal   Concentration:  Normal   Orientation:  X5   Recall/memory:  Normal   Affect and Mood  Affect:  No data recorded  Mood:  Anxious   Relating  Eye contact:  No data recorded  Facial expression:  No data recorded  Attitude toward examiner:  Cooperative   Thought and Language  Speech flow: Normal   Thought content:  Appropriate to Mood and Circumstances    Preoccupation:  Ruminations   Hallucinations:  None   Organization:  No data recorded  [employer] of Knowledge:  Average   Intelligence:  Average   Abstraction:  Normal   Judgement:  Fair   Dance movement psychotherapist:  Adequate   Insight:  Flashes of insight   Decision Making:  Archivist  Social Maturity:  Isolates   Social Judgement:  Normal   Stress  Stressors:  Family conflict   Coping Ability:  Contractor Deficits:  None   Supports:  Family     Religion: Religion/Spirituality Are You A Religious Person?: No  Leisure/Recreation: Leisure / Recreation Do You Have Hobbies?: No  Exercise/Diet: Exercise/Diet Do You Exercise?: No Have You Gained or Lost A Significant Amount of Weight in the Past Six Months?: No (pt reports weight goes up and down regularly) Do You Follow a Special Diet?: No Do You Have Any Trouble Sleeping?: No (feels like could sleep all the time if didn't have to work)   CCA Employment/Education Employment/Work Situation: Employment / Work Situation Employment situation: Employed 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 How long has patient been employed?: 3 months Patient's job has been impacted by current illness: Yes Describe how patient's job has been impacted: sometimes has to call out Has patient ever been in the [employer]?: No  Education: Education Is Patient Currently Attending School?: No Did Garment/textile technologist From [employer]?: No (dropped out at 18; GED at 21) Did You Product manager?: No Did Designer, television/film set?: No Did You Have An Individualized Education Program (IIEP): No Did You Have Any Difficulty At [employer]?: No Patient's Education Has Been Impacted by Current Illness: No   CCA Family/Childhood History Family and Relationship History: Family history Marital status: Long term relationship Long term relationship, how long?: two  years What types of issues is patient dealing with in the relationship?: ups and downs; miscarriage a couple of months ago Are you sexually active?: No What is your sexual orientation?: heterosexual Has your sexual activity been affected by drugs, alcohol, medication, or emotional stress?: yes- haven't had sex in 5 months since miscarriage- distant from boyfriend  Childhood History:  Childhood History By whom was/is the patient raised?: Both parents,Grandparents Additional childhood history information: "I was in trouble a lot. I was a really bad kid. I would skip school. I was back and forth being kicked out by mom and dad." "My mom worked a lot when I was in Chief Executive Officer school so my grandmother really watched [place] and raised [place]." Description of patient's relationship with caregiver when they were a child: ups and downs Patient's description of current relationship with people who raised him/her: Mom: good Dad: OK How were you disciplined when  you got in trouble as a child/adolescent?: varied Does patient have siblings?: Yes Number of Siblings: 2 Description of patient's current relationship with siblings: brother- we dont see him much- he has depression and medical issues; sister- OK Did patient suffer any verbal/emotional/physical/sexual abuse as a child?: No Did patient suffer from severe childhood neglect?: No Has patient ever been sexually abused/assaulted/raped as an adolescent or adult?: No Was the patient ever a victim of a crime or a disaster?: No Witnessed domestic violence?: No Has patient been affected by domestic violence as an adult?: No  Child/Adolescent Assessment:     CCA Substance Use Alcohol/Drug Use: Alcohol / Drug Use History of alcohol / drug use?: Yes Substance #1 Name of Substance 1: Alcohol 1 - Age of First Use: 16 1 - Amount (size/oz): varies 1 - Frequency: weekends 1 - Duration: a few months 1 - Last Use / Amount: this past weekend 1 - Method of  Aquiring: buying 1- Route of Use: oral Substance #2 Name of Substance 2: Nicotine 2 - Age of First Use: 18 2 - Amount (size/oz): vareis 2 - Frequency: daily 2 - Duration: years 2 - Last Use / Amount: currently 2 - Method of Aquiring: purchase 2 - Route of Substance Use: oral                     ASAM's:  Six Dimensions of Multidimensional Assessment  Dimension 1:  Acute Intoxication and/or Withdrawal Potential:      Dimension 2:  Biomedical Conditions and Complications:      Dimension 3:  Emotional, Behavioral, or Cognitive Conditions and Complications:     Dimension 4:  Readiness to Change:     Dimension 5:  Relapse, Continued use, or Continued Problem Potential:     Dimension 6:  Recovery/Living Environment:     ASAM Severity Score:    ASAM Recommended Level of Treatment:     Substance use Disorder (SUD)    Recommendations for Services/Supports/Treatments: Recommendations for Services/Supports/Treatments Recommendations For Services/Supports/Treatments: Medication Management  DSM5 Diagnoses: Patient Active Problem List   Diagnosis Date Noted  . Breast pain, right 01/06/2021  . Palpitations 01/06/2021  . Anxiety and depression 01/06/2021  . Vapes nicotine containing substance 01/06/2021  . Myopia of both eyes 01/06/2021  . Tetanus, diphtheria, and acellular pertussis (Tdap) vaccination declined 01/06/2021  . Major depressive disorder, recurrent episode, moderate (HCC) 01/06/2021  . Generalized anxiety disorder 01/06/2021  . Migraine without aura and without status migrainosus, not intractable 02/26/2015  . Episodic tension-type headache, not intractable 02/26/2015  . Poor sleep hygiene 02/26/2015  . Presence of subdermal contraceptive implant 03/08/2014  . Irregular menses 03/08/2014  . Acne 03/08/2014  . Chronic headaches 10/28/2013  . 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 Problems  with learning 09/21/2013    Patient Centered Plan: Patient is on the following Treatment Plan(s):  Depression   Referrals to Alternative Service(s): Referred to Alternative Service(s):   Place:   Date:   Time:    Referred to Alternative Service(s):   Place:   Date:   Time:    Referred to Alternative Service(s):   Place:   Date:   Time:    Referred to Alternative Service(s):   Place:   Date:   Time:     Quinn Axe, [facility]

## 2021-01-06 NOTE — Patient Instructions (Signed)
Electronic Cigarette Information  Electronic cigarettes, or e-cigarettes, are battery-operated devices that deliver nicotine--a very addictive drug--to the body. They come in many shapes, including in the shape of a cigarette, pipe, pen, and even a USB memory stick. E-cigarettes have a cartridge that contains a liquid form of nicotine. When a person uses the device, the liquid heats up. It then becomes a vapor. Inhaling this vapor is called vaping. Nicotine is thought to increase your risk for certain types of cancer. In addition to nicotine, e-cigarettes may contain other harmful and cancer-causing chemicals, including:  Formaldehyde.  Acetaldehyde.  Heavy metals.  Ultra-fine particles that can get inhaled deep into the lungs.  Chemical colorings and flavorings. It is not clear how much nicotine you get when vaping, and it is hard to know what chemicals are in the vaping liquids. The health effects of vaping are not completely known, but you should be aware of the possible dangers of using these products. Some people may use e-cigarettes in order to quit smoking tobacco. However, this has not been proven to work, and the Food and Drug Administration (FDA) has not approved e-cigarettes for this purpose. How can using electronic cigarettes affect me?  You may be at risk for developing a dangerous lung disease. There are reports of an increasing number of cases involving serious lung problems, and even death, associated with e-cigarette use. Your risk may be even higher if you: ? Buy e-cigarettes or vaping oils off the street. ? Add any substances to the e-cigarettes that are not intended by the manufacturer.  Vaping may make you crave nicotine. Nicotine does the following: ? Changes your blood sugar levels. ? Increases your heart rate, blood pressure, and breathing rate. ? Increases your risk of developing blood clots (hypercoagulable state) and diabetes. ? Increases your risk of gum disease  that may lead to losing teeth.  If you smoke e-cigarettes, you may be more likely to start smoking or to smoke more tobacco cigarettes.  Becoming addicted to nicotine may make your brain more sensitive to other addictive drugs. You may move to other addictive substances.  You may be in danger of overdosing on nicotine. Nicotine poisoning can cause nausea, vomiting, seizures, and trouble breathing.  An e-cigarette may explode and cause fires and burn injuries.  If you are pregnant, the nicotine in e-cigarettes may be harmful to your baby. Nicotine can cause: ? Brain or lung problems for your baby. ? Your baby to be born too early. ? Your baby to be born with a low birth weight.  Vaping has also been linked to decreases in memory and attention span in children and teens. What actions can I take to stop vaping? If you can, stop vaping on your own before you become addicted to nicotine. If you need help quitting, ask your health care provider. There are three effective ways to fight nicotine addiction:  Nicotine replacement therapy. Using nicotine gum or a nicotine patch blocks your craving for nicotine. Over time, you can reduce the amount of nicotine you use until you can stop using nicotine completely without having cravings.  Prescription medicines approved to fight nicotine addiction. These stop nicotine cravings or block the effects of nicotine.  Behavioral therapy. This may include: ? A self-help smoking cessation program. ? Individual or group therapy. ? A smoking cessation support group. There are several national programs to help you quit smoking or vaping. These include:  Text message programs, such as SmokefreeTXT.  Apps for mobile phones,   including the free quitSTART app.  Hotlines, such as 1-800-QUIT-NOW (1-800-784-8669). Where to find support You can get support at these sites:  U.S. Department of Health and Human Services: https://smokefree.gov  American Lung  Association: www.lung.org Where to find more information Learn more about e-cigarettes from:  National Institute on Drug Abuse: www.drugabuse.gov  Centers for Disease Control and Prevention: www.cdc.gov Summary  E-cigarettes can cause nicotine addiction.  E-cigarettes are not approved as a way to stop smoking. They are not a risk-free alternative to smoking tobacco.  There are reports of an increasing number of cases involving serious lung problems, and even death, associated with e-cigarette use.  If you can stop vaping on your own, do it before you become addicted to nicotine. If you need help quitting, ask your health care provider.  There are various methods and programs that can help you stop smoking or vaping. This information is not intended to replace advice given to you by your health care provider. Make sure you discuss any questions you have with your health care provider. Document Revised: 02/21/2019 Document Reviewed: 01/06/2019 Elsevier Patient Education  2021 Elsevier Inc.  

## 2021-01-06 NOTE — Progress Notes (Signed)
Patient ID: Lauren [name], female    DOB: 1996-01-19  MRN: [id number]  CC: Annual Exam   Subjective: Lauren [name] is a 25 y.o. female who presents for annual physical exam Her concerns today include:  Hx Migraines  Pt is G1P0 No paps in past. No vaginal dischg or itching. Sexually active with one female partner Not on any BC method and does not wish to be. Had miscarriage 07/2020 Menses are regular lasting 6 days.  LNMP began 12/13/2020. + cramps with menses Fhx of breast cancer maternal GGM and paternal aunt and GM.  Fhx of ovarian CA in a maternal great aunt Reports some soreness in RT breast that goes and comes. Occurs randomly and not necessarily around menses.  No nipple discgh.  Last episode occurred 5 days ago. Can last an entire day.  Takes Ibuprofen which helps. Occurs every couple of months.  Does not drink caffinated beverages  Wgh:  Wgh today in range for being overweight.  wgh last night was 142 lbs.  She weighed today with her heavy pocketbook in hand and layers of clothing  Tob Use:  Started smoking cigarettes at age 82. Later changed to cigar. Last yr she changed to vaping.  HM:  Due for Tdap.  She declines.  Never got COVID vaccine and does not plan to. Patient Active Problem List   Diagnosis Date Noted  . Migraine without aura and without status migrainosus, not intractable 02/26/2015  . Episodic tension-type headache, not intractable 02/26/2015  . Poor sleep hygiene 02/26/2015  . Presence of subdermal contraceptive implant 03/08/2014  . Irregular menses 03/08/2014  . Acne 03/08/2014  . Chronic headaches 10/28/2013  . 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 Problems with learning 09/21/2013     Current Outpatient Medications on File Prior to Visit  Medication Sig Dispense Refill  . amoxicillin-clavulanate (AUGMENTIN) 875-125 MG tablet Take 1 tablet by mouth every 12 (twelve) hours. (Patient  not taking: Reported on 01/06/2021) 14 tablet 0  . benzonatate (TESSALON) 100 MG capsule Take 1-2 capsules (100-200 mg total) by mouth 3 (three) times daily as needed. (Patient not taking: Reported on 01/06/2021) 60 capsule 0  . cetirizine (ZYRTEC ALLERGY) 10 MG tablet Take 1 tablet (10 mg total) by mouth daily. (Patient not taking: Reported on 01/06/2021) 30 tablet 0  . Prenatal Vit-Fe Fumarate-FA (PRENATAL MULTIVITAMIN) TABS tablet Take 1 tablet by mouth daily at 12 noon.  (Patient not taking: Reported on 01/06/2021)    . promethazine-dextromethorphan (PROMETHAZINE-DM) 6.25-15 MG/5ML syrup Take 5 mLs by mouth at bedtime as needed for cough. (Patient not taking: Reported on 01/06/2021) 100 mL 0  . pseudoephedrine (SUDAFED) 30 MG tablet Take 1 tablet (30 mg total) by mouth every 8 (eight) hours as needed for congestion. (Patient not taking: Reported on 01/06/2021) 30 tablet 0   No current facility-administered medications on file prior to visit.    Allergies  Allergen Reactions  . Codeine Palpitations and Other (See Comments)    Speeds up heart rate  . Prednisone Other (See Comments)    Extreme crying Back breaks out   . Septra [Bactrim] Nausea And Vomiting  . Sulfa Antibiotics Anaphylaxis and Nausea And Vomiting  . Other Itching, Swelling and Rash    TACO SEASONING (MARCUM BRAND FROM [employer])    Social History   Socioeconomic History  . Marital status: Single    Spouse name: Not on file  . Number of children:  0  . Years of education: Not on file  . Highest education level: Not on file  Occupational History  . Not on file  Tobacco Use  . Smoking status: Former Smoker    Quit date: 2021    Years since quitting: 1.1  . Smokeless tobacco: Never Used  . Tobacco comment: Mom smokes   Vaping Use  . Vaping Use: Every day  . Substances: Nicotine  Substance and Sexual Activity  . Alcohol use: Not Currently    Comment: occ  . Drug use: No  . Sexual activity: Yes    Birth  control/protection: None  Other Topics Concern  . Not on file  Social History Narrative  . Not on file   Social Determinants of Health   Financial Resource Strain: Not on file  Food Insecurity: Not on file  Transportation Needs: Not on file  Physical Activity: Not on file  Stress: Not on file  Social Connections: Not on file  Intimate Partner Violence: Not on file    Family History  Problem Relation Age of Onset  . Migraines Mother   . Menstrual problems Mother   . Diabetes Mother   . Anxiety disorder Mother        xanax  . Depression Mother        prozac  . Thyroid disease Sister   . Diabetes Maternal Grandmother   . Dementia Maternal Grandmother        Died at [id number]  . Diabetes Maternal Grandfather   . Emphysema Maternal Grandfather        Died at 8769  . Febrile seizures Brother        Died at the age of 23 years old  . Pneumonia Paternal Grandmother        Died at 6247  . Heart failure Paternal Grandfather        Died at 8782    Past Surgical History:  Procedure Laterality Date  . ADENOIDECTOMY W/ MYRINGOTOMY  [id number]   25 Years old   . TONSILLECTOMY  [id number]   [age] Years old   . TYMPANOSTOMY TUBE PLACEMENT Bilateral 1999, 200500, [id number]   228 weeks old, 25 year old and 25 years old   . WISDOM TOOTH EXTRACTION  [id number]   25 Years old     ROS: Review of Systems  HENT: Positive for dental problem (teeth cracked.  Needs dentist.). Negative for hearing loss, sinus pressure and sore throat.   Eyes:       Problems with distant vision and at nights.  Had rxn glasses but loss.  Last eye exam.  Respiratory: Negative for cough and shortness of breath.   Cardiovascular: Positive for palpitations (since age 25. Occurring more often.  Occurs a few times a day lasting 30 mins. Did a holter for 1 day at age 25.  No arrhythmia detected.). Negative for chest pain.  Genitourinary: Negative for difficulty urinating and dysuria.  Psychiatric/Behavioral:       Dx with dep/anx and bipolar ds at age 25.  Will be speaking with a therapist today at 3 p.m and appt with psychiatrist March 15 with [facility].     PHYSICAL EXAM: BP 116/75   Pulse 85   Ht 5\' 4"  (1.626 m)   Wt 148 lb 3.2 oz (67.2 kg)   LMP 06/12/2020   SpO2 99%   BMI 25.44 kg/m   Physical Exam  General appearance - alert, well appearing, young caucasian female and in no distress Mental  status - normal mood, behavior, speech, dress, motor activity, and thought processes Eyes - pupils equal and reactive, extraocular eye movements intact Ears -small amount of wax in the right ear canal.  Canals and TMs are within normal limits nose - normal and patent, no erythema, discharge or polyps Mouth - mucous membranes moist, pharynx normal without lesions Neck - supple, no significant adenopathy Lymphatics - no palpable lymphadenopathy, no hepatosplenomegaly Chest - clear to auscultation, no wheezes, rales or rhonchi, symmetric air entry Heart - normal rate, regular rhythm, normal S1, S2, no murmurs, rubs, clicks or gallops Abdomen - soft, nontender, nondistended, no masses or organomegaly Breasts - CMA Pollock present for breast and pelvic exam:  breasts appear normal, no suspicious masses, no skin or nipple changes or axillary nodes Pelvic - normal external genitalia, vulva, vagina, cervix, uterus and adnexa.  Moderate amount of white dischg in vaginal valut Neurological - cranial nerves II through XII intact, motor and sensory grossly normal bilaterally Musculoskeletal - no joint tenderness, deformity or swelling Extremities - peripheral pulses normal, no pedal edema, no clubbing or cyanosis Skin -mild acne over the cheeks.  She has tattoos on forearms and legs  EKG:  NSR, normal EKG.  Depression screen [facility] 2/9 01/06/2021 08/19/2020  Decreased Interest 1 0  Down, Depressed, Hopeless 1 0  PHQ - 2 Score 2 0  Altered sleeping 0 -  Tired, decreased energy 1 -  Change in appetite 0 -  Feeling bad or failure about yourself  1 -   Trouble concentrating 1 -  Moving slowly or fidgety/restless 0 -  Suicidal thoughts 0 -  PHQ-9 Score 5 -   GAD 7 : Generalized Anxiety Score 01/06/2021 08/19/2020  Nervous, Anxious, on Edge 0 0  Control/stop worrying 1 0  Worry too much - different things 1 0  Trouble relaxing 1 0  Restless 0 0  Easily annoyed or irritable 1 0  Afraid - awful might happen 0 0  Total GAD 7 Score 4 0     CMP Latest Ref Rng & Units 05/31/2020 12/20/2018 10/02/2018  Glucose 70 - 99 mg/dL 89 89 87  BUN 6 - 20 mg/dL 7 8 11   Creatinine 0.44 - 1.00 mg/dL 2.87 8.67  Sodium 135 - 145 mmol/L 138 139 144  Potassium 3.5 - 5.1 mmol/L 3.9 3.6 3.7  Chloride 98 - 111 mmol/L 106 106 109  CO2 22 - 32 mmol/L 23 26 23   Calcium 8.9 - 10.3 mg/dL 9.3 9.2 9.6  Total Protein 6.5 - 8.1 g/dL 6.9 7.8 6.72)  Total Bilirubin 0.3 - 1.2 mg/dL ) 2.0(H) 1.8(H)  Alkaline Phos 38 - 126 U/L 55 49 57  AST 15 - 41 U/L 17 20 23   ALT 0 - 44 U/L 18 21 27    Lipid Panel  No results found for: CHOL, TRIG, HDL, CHOLHDL, VLDL, LDLCALC, LDLDIRECT  CBC    Component Value Date/Time   WBC 9.0 07/26/2020 1313   RBC 4.37 07/26/2020 1313   HGB 13.1 07/26/2020 1313   HCT 39.5 07/26/2020 1313   PLT 222 07/26/2020 1313   MCV 90.4 07/26/2020 1313   MCH 30.0 07/26/2020 1313   MCHC 33.2 07/26/2020 1313   RDW 12.4 07/26/2020 1313   LYMPHSABS 6.7 03/15/2013 1509   MONOABS 1.5 (H) 03/15/2013 1509   EOSABS 0.1 03/15/2013 1509   BASOSABS 0.1 03/15/2013 1509    ASSESSMENT AND PLAN: 1. Annual physical exam  2. Pap smear for cervical cancer screening -  Cytology - PAP - Cervicovaginal ancillary only  3. Breast pain, right - US BREAST BILATERAL  4. Palpitations We will check a TSH and EKG.  If TSH normal.  Will refer to cardiology for Holter. - TSH - EKG 12-Lead  5. Anxiety and depression She is plugged into [facility].  6. Vapes nicotine containing substance Advised to quit.  Discussed health risk associated with vaping. Pt  ready to give a trail of quitting.  Encouraged to set quit date.  7. Myopia of both eyes - Ambulatory referral to Ophthalmology  8. Tetanus, diphtheria, and acellular pertussis (Tdap) vaccination declined    Patient was given the opportunity to ask questions.  Patient verbalized understanding of the plan and was able to repeat key elements of the plan.   Orders Placed This Encounter  Procedures  . US BREAST BILATERAL  . TSH  . Ambulatory referral to Ophthalmology  . EKG 12-Lead     Requested Prescriptions    No prescriptions requested or ordered in this encounter    Return if symptoms worsen or fail to improve.  Jonah Blue, MD, FACP

## 2021-01-07 LAB — CERVICOVAGINAL ANCILLARY ONLY
Bacterial Vaginitis (gardnerella): POSITIVE — AB
Candida Glabrata: NEGATIVE
Candida Vaginitis: NEGATIVE
Chlamydia: NEGATIVE
Comment: NEGATIVE
Comment: NEGATIVE
Comment: NEGATIVE
Comment: NEGATIVE
Comment: NEGATIVE
Comment: NORMAL
Neisseria Gonorrhea: POSITIVE — AB
Trichomonas: NEGATIVE

## 2021-01-07 LAB — TSH: TSH: 0.602 u[IU]/mL (ref 0.450–4.500)

## 2021-01-08 ENCOUNTER — Telehealth: Payer: Self-pay | Admitting: Internal Medicine

## 2021-01-08 ENCOUNTER — Ambulatory Visit: Payer: Medicaid Other | Attending: Internal Medicine

## 2021-01-08 ENCOUNTER — Ambulatory Visit: Payer: Medicaid Other

## 2021-01-08 ENCOUNTER — Other Ambulatory Visit: Payer: Self-pay

## 2021-01-08 DIAGNOSIS — A549 Gonococcal infection, unspecified: Secondary | ICD-10-CM

## 2021-01-08 MED ORDER — METRONIDAZOLE 500 MG PO TABS: 500.0000 mg | ORAL_TABLET | Freq: Two times a day (BID) | ORAL | 0 refills | Status: DC

## 2021-01-08 MED ORDER — CEFTRIAXONE SODIUM 500 MG IJ SOLR
500.0000 mg | Freq: Once | INTRAMUSCULAR | Status: AC
  Administered 2021-01-08: 500 mg via INTRAMUSCULAR

## 2021-01-08 NOTE — Progress Notes (Signed)
Pt came into the office for a rocephin 500mg  injection    Pt tolerated well and doesn't have any questions or concerns

## 2021-01-08 NOTE — Telephone Encounter (Signed)
Phone call placed to patient today to go over lab results.  Patient informed that she tested positive for gonorrhea which is a sexually transmitted infection.  Advised patient that both she and her sex partner need to be treated before having unprotected sex again.  Ideally they should both be treated at the same time and then wait 7 days before having unprotected sex after treatment.  Informed that she will need to be treated with antibiotic injection called Rocephin. She also tested positive for bacterial vaginosis which is not a sexually transmitted infection.  Patient advised that if she douches she should discontinue doing so.  Prescription for antibiotic called Flagyl to treat this infection will be sent to her pharmacy.  Thyroid level normal. Patient states that she will come by the clinic today after 4 p.m

## 2021-01-09 LAB — CYTOLOGY - PAP
Comment: NEGATIVE
Diagnosis: UNDETERMINED — AB
High risk HPV: NEGATIVE

## 2021-01-22 ENCOUNTER — Telehealth (HOSPITAL_COMMUNITY): Payer: Medicaid Other | Admitting: Psychiatry

## 2021-03-06 ENCOUNTER — Other Ambulatory Visit (HOSPITAL_COMMUNITY): Payer: Self-pay | Admitting: Obstetrics and Gynecology

## 2021-03-06 ENCOUNTER — Other Ambulatory Visit: Payer: Self-pay | Admitting: Obstetrics and Gynecology

## 2021-03-06 DIAGNOSIS — Z3491 Encounter for supervision of normal pregnancy, unspecified, first trimester: Secondary | ICD-10-CM

## 2021-03-10 ENCOUNTER — Other Ambulatory Visit: Payer: Self-pay | Admitting: Obstetrics and Gynecology

## 2021-03-10 ENCOUNTER — Ambulatory Visit
Admission: RE | Admit: 2021-03-10 | Discharge: 2021-03-10 | Disposition: A | Discharge status: Home / Self Care | Payer: Medicaid Other | Source: Ambulatory Visit | Attending: Obstetrics and Gynecology | Admitting: Obstetrics and Gynecology

## 2021-03-10 ENCOUNTER — Other Ambulatory Visit: Payer: Self-pay

## 2021-03-10 DIAGNOSIS — Z3491 Encounter for supervision of normal pregnancy, unspecified, first trimester: Secondary | ICD-10-CM | POA: Insufficient documentation

## 2021-03-10 DIAGNOSIS — O3680X0 Pregnancy with inconclusive fetal viability, not applicable or unspecified: Secondary | ICD-10-CM | POA: Diagnosis not present

## 2021-03-10 DIAGNOSIS — N83201 Unspecified ovarian cyst, right side: Secondary | ICD-10-CM | POA: Diagnosis not present

## 2021-03-10 DIAGNOSIS — Z3A01 Less than 8 weeks gestation of pregnancy: Secondary | ICD-10-CM | POA: Diagnosis not present

## 2021-03-11 DIAGNOSIS — O3680X9 Pregnancy with inconclusive fetal viability, other fetus: Secondary | ICD-10-CM | POA: Diagnosis not present

## 2021-03-17 DIAGNOSIS — O3680X9 Pregnancy with inconclusive fetal viability, other fetus: Secondary | ICD-10-CM | POA: Diagnosis not present

## 2021-03-17 DIAGNOSIS — Z3A01 Less than 8 weeks gestation of pregnancy: Secondary | ICD-10-CM | POA: Diagnosis not present

## 2021-03-17 DIAGNOSIS — O021 Missed abortion: Secondary | ICD-10-CM | POA: Diagnosis not present

## 2021-04-05 ENCOUNTER — Other Ambulatory Visit: Payer: Self-pay

## 2021-04-05 ENCOUNTER — Inpatient Hospital Stay (HOSPITAL_COMMUNITY)
Admission: AD | Admit: 2021-04-05 | Discharge: 2021-04-05 | Disposition: A | Discharge status: Home / Self Care | Payer: Medicaid Other | Attending: Obstetrics and Gynecology | Admitting: Obstetrics and Gynecology

## 2021-04-05 ENCOUNTER — Encounter (HOSPITAL_COMMUNITY): Payer: Self-pay | Admitting: Obstetrics and Gynecology

## 2021-04-05 ENCOUNTER — Inpatient Hospital Stay (HOSPITAL_COMMUNITY): Payer: Medicaid Other

## 2021-04-05 DIAGNOSIS — Z3A09 9 weeks gestation of pregnancy: Secondary | ICD-10-CM | POA: Insufficient documentation

## 2021-04-05 DIAGNOSIS — O26891 Other specified pregnancy related conditions, first trimester: Secondary | ICD-10-CM | POA: Diagnosis not present

## 2021-04-05 DIAGNOSIS — Z87891 Personal history of nicotine dependence: Secondary | ICD-10-CM | POA: Insufficient documentation

## 2021-04-05 DIAGNOSIS — O039 Complete or unspecified spontaneous abortion without complication: Secondary | ICD-10-CM | POA: Insufficient documentation

## 2021-04-05 DIAGNOSIS — O209 Hemorrhage in early pregnancy, unspecified: Secondary | ICD-10-CM

## 2021-04-05 DIAGNOSIS — Z6790 Unspecified blood type, Rh positive: Secondary | ICD-10-CM | POA: Diagnosis not present

## 2021-04-05 DIAGNOSIS — Z3A00 Weeks of gestation of pregnancy not specified: Secondary | ICD-10-CM | POA: Diagnosis not present

## 2021-04-05 DIAGNOSIS — O034 Incomplete spontaneous abortion without complication: Secondary | ICD-10-CM

## 2021-04-05 DIAGNOSIS — N939 Abnormal uterine and vaginal bleeding, unspecified: Secondary | ICD-10-CM | POA: Diagnosis not present

## 2021-04-05 DIAGNOSIS — R109 Unspecified abdominal pain: Secondary | ICD-10-CM | POA: Diagnosis not present

## 2021-04-05 LAB — CBC
HCT: 39.4 % (ref 36.0–46.0)
Hemoglobin: 13.1 g/dL (ref 12.0–15.0)
MCH: 29.8 pg (ref 26.0–34.0)
MCHC: 33.2 g/dL (ref 30.0–36.0)
MCV: 89.7 fL (ref 80.0–100.0)
Platelets: 246 10*3/uL (ref 150–400)
RBC: 4.39 MIL/uL (ref 3.87–5.11)
RDW: 12 % (ref 11.5–15.5)
WBC: 10.8 10*3/uL — ABNORMAL HIGH (ref 4.0–10.5)
nRBC: 0 % (ref 0.0–0.2)

## 2021-04-05 LAB — HIV ANTIBODY (ROUTINE TESTING W REFLEX): HIV Screen 4th Generation wRfx: NONREACTIVE

## 2021-04-05 MED ORDER — PROMETHAZINE HCL 12.5 MG PO TABS: 12.5000 mg | ORAL_TABLET | Freq: Four times a day (QID) | ORAL | 0 refills | Status: DC | PRN

## 2021-04-05 MED ORDER — MISOPROSTOL 200 MCG PO TABS: 600.0000 ug | ORAL_TABLET | Freq: Once | ORAL | 0 refills | Status: DC

## 2021-04-05 MED ORDER — IBUPROFEN 600 MG PO TABS: 600.0000 mg | ORAL_TABLET | Freq: Four times a day (QID) | ORAL | 1 refills | Status: DC | PRN

## 2021-04-05 MED ORDER — TRAMADOL HCL 50 MG PO TABS: 50.0000 mg | ORAL_TABLET | Freq: Four times a day (QID) | ORAL | 0 refills | Status: DC | PRN

## 2021-04-05 NOTE — MAU Note (Signed)
Pt reports to mau with c/o vag bleeding since Thursday. Pt reports she is passing quarter sized clots and is having "heavy bleeding" Reports she is not saturating the pad in less than 1 hour.  Also reports some lower abd pain.

## 2021-04-05 NOTE — Discharge Instructions (Signed)
Cytotec for Pregnancy Failure FACTS YOU SHOULD KNOW  WHAT IS AN EARLY PREGNANCY FAILURE? Once the egg is fertilized with the sperm and begins to develop, it attaches to the lining of the uterus. This early pregnancy tissue may not develop into an embryo (the beginning stage of a baby). Sometimes an embryo does develop but does not continue to grow. These problems can be seen on ultrasound.   MANAGEMENT OF EARLY PREGNANCY FAILURE: About 4 out of 100 (0.25%) women will have a pregnancy loss in her lifetime.  One in five pregnancies is found to be an early pregnancy failure.  There are 3 ways to care for an early pregnancy failure:   (1) Surgery, (2) Medicine, (3) Waiting for you to pass the pregnancy on your own. The decision as to how to proceed after being diagnosed with and early pregnancy failure is an individual one.  The decision can be made only after appropriate counseling.  You need to weigh the pros and cons of the 3 choices. Then you can make the choice that works for you. SURGERY (D&E) . Procedure over in 1 day . Requires being put to sleep . Bleeding may be light . Possible problems during surgery, including injury to womb(uterus) . Care provider has more control Medicine (CYTOTEC) . The complete procedure may take days to weeks . No Surgery . Bleeding may be heavy at times . There may be drug side effects . Patient has more control Waiting . You may choose to wait, in which case your own body may complete the passing of the abnormal early pregnancy on its own in about 2-4 weeks . Your bleeding may be heavy at times . There is a small possibility that you may need surgery if the bleeding is too much or not all of the pregnancy has passed. CYTOTEC MANAGEMENT Prostaglandins (cytotec) are the most widely used drug for this purpose. They cause the uterus to cramp and contract. You will place the medicine yourself inside your vagina in the privacy of your home. Empting of the uterus  should occur within 3 days but the process may continue for several weeks. The bleeding may seem heavy at times. POSSIBLE SIDE EFFECTS FROM CYTOTEC . Nausea   Vomiting . Diarrhea Fever . Chills  Hot Flashes Side effects  from the process of the early pregnancy failure include: . Cramping  Bleeding . Headaches  Dizziness RISKS: This is a low risk procedure. Less than 1 in 100 women has a complication. An incomplete passage of the early pregnancy may occur. Also, Hemorrhage (heavy bleeding) could happen.  Rarely the pregnancy will not be passed completely. Excessively heavy bleeding may occur.  Your doctor may need to perform surgery to empty the uterus (D&E). Afterwards: Everybody will feel differently after the early pregnancy completion. You may have soreness or cramps for a day or two. You may have soreness or cramps for day or two.  You may have light bleeding for up to 2 weeks. You may be as active as you feel like being. If you have any of the following problems you may call M[facility] Admissions Unit at [phone]. . If you have pain that does not get better  with pain medication . Bleeding that soaks through 2 thick full-sized sanitary pads in an hour . Cramps that last longer than 2 days . Foul smelling discharge . Fever above 100.4 degrees F Even if you do not have any of these symptoms, you should have a follow-up exam  to make sure you are healing properly. This appointment will be made for you before you leave the hospital. Your next normal period will start again in 4-6 week after the loss. You can get pregnant soon after the loss, so use birth control right away. Finally: Make sure all your questions are answered before during and after any procedure. Follow up with medical care and family planning methods.

## 2021-04-05 NOTE — MAU Provider Note (Addendum)
Chief Complaint: Abdominal Pain and Vaginal Bleeding   Event Date/Time   First Provider Initiated Contact with Patient 04/05/21 1939     SUBJECTIVE HPI: Lauren [name] is a 25 y.o. G2P0010 at [redacted]w[redacted]d by early [place] who states she was told by Nigel Bridgeman, CNM at [facility] on 03/17/21 (records not available) that she was having a miscarriage presents to Maternity Admissions reporting heavy bleeding, passing clots and severe cramping x  36 hours. States she was given the option of what sounds like a D&C but was too busy to decide what to do. States she had vaginal swabs done in the office and declines retesting for infections.   Vaginal Bleeding: mod-heavy Passage of tissue or clots: + clots. Unsure if she passed tissue Dizziness: Denies  O POS  Pain Location: Low abd Quality: cramping Severity: 9/10 on pain scale Duration: ~36 hours Course: Worsening Context: Told that she was miscarrying. Records not available although previous US 03/10/21 confirmed IUP.  Timing: intermittent  Modifying factors: No improvement w/ Tylenol  Associated signs and symptoms: Neg for fever, chills, vaginal discharge, dizziness, GO complaints, Urinary complaints. Pos for vaginal bleeding.   Past Medical History:  Diagnosis Date  . Dysrhythmia   . Migraines    OB History  Gravida Para Term Preterm AB Living  2 0 0 0 1 0  SAB IAB Ectopic Multiple Live Births  1 0 0 0 0    # Outcome Date GA Lbr Len/2nd Weight Sex Delivery Anes PTL Lv  2 Current           1 SAB            Past Surgical History:  Procedure Laterality Date  . ADENOIDECTOMY W/ MYRINGOTOMY  1665   25 Years old   . TONSILLECTOMY  818   25 Years old   . TYMPANOSTOMY TUBE PLACEMENT Bilateral 1999, 20073, 1030   19 weeks old, 25 year old and 25 years old   . WISDOM TOOTH EXTRACTION  2583   25 Years old    Social History   Socioeconomic History  . Marital status: Single    Spouse name: Not on file  . Number of children: 0  . Years of education: Not  on file  . Highest education level: Not on file  Occupational History  . Not on file  Tobacco Use  . Smoking status: Former Smoker    Quit date: 2021    Years since quitting: 1.4  . Smokeless tobacco: Never Used  . Tobacco comment: Mom smokes   Vaping Use  . Vaping Use: Every day  . Substances: Nicotine  Substance and Sexual Activity  . Alcohol use: Not Currently    Comment: occ  . Drug use: No  . Sexual activity: Yes    Birth control/protection: None  Other Topics Concern  . Not on file  Social History Narrative  . Not on file   Social Determinants of Health   Financial Resource Strain: Not on file  Food Insecurity: Not on file  Transportation Needs: Not on file  Physical Activity: Not on file  Stress: Not on file  Social Connections: Not on file  Intimate Partner Violence: Not on file   No current facility-administered medications on file prior to encounter.   Current Outpatient Medications on File Prior to Encounter  Medication Sig Dispense Refill  . metroNIDAZOLE (FLAGYL) 500 MG tablet Take 1 tablet (500 mg total) by mouth 2 (two) times daily. 14 tablet 0  Allergies  Allergen Reactions  . Codeine Palpitations and Other (See Comments)    Speeds up heart rate  . Prednisone Other (See Comments)    Extreme crying Back breaks out   . Septra [Bactrim] Nausea And Vomiting  . Sulfa Antibiotics Anaphylaxis and Nausea And Vomiting  . Other Itching, Swelling and Rash    TACO SEASONING (MARCUM BRAND FROM [employer])    I have reviewed the past Medical Hx, Surgical Hx, Social Hx, Allergies and Medications.   Review of Systems  Constitutional: Negative for chills and fever.  Gastrointestinal: Positive for abdominal pain. Negative for constipation, diarrhea, nausea and vomiting.  Genitourinary: Positive for pelvic pain and vaginal bleeding. Negative for dysuria and vaginal discharge.  Musculoskeletal: Positive for back pain.  Neurological: Negative for dizziness.     OBJECTIVE Patient Vitals for the past 24 hrs:  BP Temp Temp src Pulse Resp SpO2  04/05/21 2004 111/85 -- -- 87 16 100 %  04/05/21 1854 -- -- -- -- -- 100 %  04/05/21 1850 128/84 98.9 F (37.2 C) Oral 88 17 --   Constitutional: Well-developed, well-nourished female in no acute distress.  Skin: No pallor Cardiovascular: normal rate Respiratory: normal rate and effort.  GI: Abd soft, non-tender. MS: Extremities nontender, no edema, normal ROM Neurologic: Alert and oriented x 4.  GU:  SPECULUM EXAM: NEFG, physiologic discharge, mod amount blood noted, cervix clean, anterior  BIMANUAL: cervix closed; UTA uterine size due to retroverted uterus, no adnexal tenderness or masses. No CMT.  LAB RESULTS Results for orders placed or performed during the hospital encounter of 04/05/21 (from the past 24 hour(s))  CBC     Status: Abnormal   Collection Time: 04/05/21  8:43 PM  Result Value Ref Range   WBC 10.8 (H) 4.0 - 10.5 K/uL   RBC 4.39 3.87 - 5.11 MIL/uL   Hemoglobin 13.1 12.0 - 15.0 g/dL   HCT 94.4 96.7 - 59.1 %   MCV 89.7 80.0 - 100.0 fL   MCH 29.8 26.0 - 34.0 pg   MCHC 33.2 30.0 - 36.0 g/dL   RDW 63.8 46.6 - 59.9 %   Platelets 246 150 - 400 K/uL   nRBC 0.0 0.0 - 0.2 %  HIV Antibody (routine testing w rflx)     Status: None   Collection Time: 04/05/21  8:43 PM  Result Value Ref Range   HIV Screen 4th Generation wRfx Non Reactive Non Reactive    IMAGING US OB Transvaginal  Result Date: 04/05/2021 CLINICAL DATA:  Heavy vaginal bleeding. EXAM: OBSTETRIC <14 WK [place] AND TRANSVAGINAL OB US TECHNIQUE: Both transabdominal and transvaginal ultrasound examinations were performed for complete evaluation of the gestation as well as the maternal uterus, adnexal regions, and pelvic cul-de-sac. Transvaginal technique was performed to assess early pregnancy. COMPARISON:  Mar 10, 2021 FINDINGS: Intrauterine gestational sac: None Yolk sac:  Not Visualized. Embryo:  Not Visualized. Maternal  uterus/adnexae: Large amount of blood products in the lower uterine segment/cervix. Corpus luteal cyst on the right ovary. The left ovary is not visualized. IMPRESSION: Findings consistent with failed pregnancy. Blood products within the lower uterine segment/cervix. Electronically Signed   By: Ted Mcalpine M.D.   On: 04/05/2021 20:34   US OB LESS THAN 14 WEEKS WITH OB TRANSVAGINAL  Result Date: 03/10/2021 CLINICAL DATA:  Supervision of normal first trimester pregnancy, assessment of viability EXAM: OBSTETRIC <14 WK [place] AND TRANSVAGINAL OB US TECHNIQUE: Both transabdominal and transvaginal ultrasound examinations were performed for complete evaluation of the  gestation as well as the maternal uterus, adnexal regions, and pelvic cul-de-sac. Transvaginal technique was performed to assess early pregnancy. COMPARISON:  None for this gestation FINDINGS: Intrauterine gestational sac: Present, single Yolk sac:  Present Embryo:  Present Cardiac Activity: Not identified Heart Rate: N/A  bpm CRL:  2.3 mm   5 w   5 d                  [place] EDC: 11/05/2021 Subchorionic hemorrhage:  None visualized. Maternal uterus/adnexae: Remainder of uterus unremarkable. Corpus luteal cyst RIGHT ovary. Ovaries and adnexa otherwise unremarkable. No free pelvic fluid or adnexal masses. IMPRESSION: Gestational sac seen within the uterus containing a yolk sac and a fetal pole. No fetal cardiac activity is identified however. Findings are suspicious but not yet definitive for failed pregnancy. Recommend follow-up US in 10-14 days for definitive diagnosis. This recommendation follows SRU consensus guidelines: Diagnostic Criteria for Nonviable Pregnancy Early in the First Trimester. Malva Limes Med 2013; 784:6962-95. Electronically Signed   By: Ulyses Southward M.D.   On: 03/10/2021 11:30    MAU COURSE CBC, ultrasound, UA  Care of pt turned over to Sharen Counter, CNM at 2050.   Hurshel Party, CNM 04/05/2021  10:18 PM  MDM: Pt  with minimal cramping and report of light bleeding only after exam/ultrasounds in MAU.  US shows blood products in lower uterine segment.  CBC with normal Hgb at 13. 1.  Discussed options with pt including expectant management,  Cytotec given buccally or vaginally, or scheduling a D&C. Pt had discussed D&C with her providers in the office but would rather avoid surgery if possible.  Given that the miscarriage has started and given [place] and lab findings today, she selects Cytotec to take as a prescription at home and will follow up this week with [facility].  Bleeding/miscarriage/return precautions were reviewed with pt and questions answered.   Early Intrauterine Pregnancy Failure  __x_  Documented intrauterine pregnancy failure less than or equal to [redacted] weeks gestation  _x__  No serious current illness  _x__  Baseline Hgb greater than or equal to 10g/dl  __x_  Patient has easily accessible transportation to the hospital  __x_  Clear preference  __x_  Practitioner/physician deems patient reliable  _x__  Counseling by practitioner or physician  __x_  Patient education by RN   n/a   Consent form signed, n/a with Rx for Cytotec   n/a   Rho-Gam given by RN if indicated  n/a      Medication dispensed, Rx written  _x__   Cytotec 600 mcg  x   Buccal dose by patient at home           __x_  Ibuprofen 600 mg 1 tablet by mouth every 6 hours as needed #30  _x__  Tramadol 50-100 mg Q 6 hours PRN x 10 tabs  _x__  Phenergan 12.5- 25 mg by mouth every 6 hours as needed for nausea    A/P: 1. Incomplete miscarriage   2. Vaginal bleeding in pregnancy, first trimester   3. Blood type, Rh positive     D/C home with bleeding/return precautions Close f/u with [facility] outpatient  Sharen Counter, CNM 10:19 PM

## 2021-04-21 ENCOUNTER — Ambulatory Visit: Payer: Self-pay | Admitting: *Deleted

## 2021-04-21 NOTE — Telephone Encounter (Signed)
Reason for Disposition  [1] Sinus congestion as part of a cold AND [2] present < 10 days  Answer Assessment - Initial Assessment Questions 1. LOCATION: "Where does it hurt?"      Face and cheeks  2. ONSET: "When did the sinus pain start?"  (e.g., hours, days)      04/18/21 3. SEVERITY: "How bad is the pain?"   (Scale 1-10; mild, moderate or severe)   - MILD (1-3): doesn't interfere with normal activities    - MODERATE (4-7): interferes with normal activities (e.g., work or school) or awakens from sleep   - SEVERE (8-10): excruciating pain and patient unable to do any normal activities        mild 4. RECURRENT SYMPTOM: "Have you ever had sinus problems before?" If Yes, ask: "When was the last time?" and "What happened that time?"      All of the time 5. NASAL CONGESTION: "Is the nose blocked?" If Yes, ask: "Can you open it or must you breathe through your mouth?"     Yes , blocked on one side for a while and then the other side gets blocked 6. NASAL DISCHARGE: "Do you have discharge from your nose?" If so ask, "What color?"     Yes ,Green and yellow  7. FEVER: "Do you have a fever?" If Yes, ask: "What is it, how was it measured, and when did it start?"      no 8. OTHER SYMPTOMS: "Do you have any other symptoms?" (e.g., sore throat, cough, earache, difficulty breathing)     Sore throat, earache but better now. Sinus drainage down back of throat 9. PREGNANCY: "Is there any chance you are pregnant?" "When was your last menstrual period?"     No period since March. Miscarriage in last 2 months  Protocols used: Sinus Pain or Congestion-A-AH

## 2021-04-21 NOTE — Telephone Encounter (Signed)
C/o sinus discomfort since 04/18/21. Reports nasal drip , back of throat. Sore throat earache at first now better. Nose becomes blocked and one side will be blocked and then the other side will get blocked. Headache reported and pain in face at cheeks. Took at home test for covid and results negative. Denies chest pain , difficulty breathing, fever. Blowing nose for green and yellow mucus. Patient report she has not had a period since March due to a miscarriage and was given a "pill" at ED. Patient requesting any medication to help with symptoms. Patient has been trying OTC medications and has not been effective. No available appt until August. Care advise given. Patient verbalized understanding of care advise and to call back or go to [facility] or ED if symptoms worsen.

## 2021-04-22 NOTE — Telephone Encounter (Signed)
Contacted pt to schedule a virtual appt pt didn't answer lvm  ?

## 2021-04-24 ENCOUNTER — Ambulatory Visit: Payer: Medicaid Other | Attending: Internal Medicine | Admitting: Internal Medicine

## 2021-04-24 ENCOUNTER — Other Ambulatory Visit: Payer: Self-pay

## 2021-04-24 DIAGNOSIS — Z5329 Procedure and treatment not carried out because of patient's decision for other reasons: Secondary | ICD-10-CM

## 2021-04-24 DIAGNOSIS — Z91199 Patient's noncompliance with other medical treatment and regimen due to unspecified reason: Secondary | ICD-10-CM

## 2021-04-24 NOTE — Progress Notes (Signed)
Phone call placed to patient at 8:16 AM.  She had an 8:10 AM appointment.  She did not answer.  I left a message informing who I am and that I was calling to do her visit.  I called back again in several minutes and patient did not answer.

## 2021-04-29 DIAGNOSIS — O039 Complete or unspecified spontaneous abortion without complication: Secondary | ICD-10-CM | POA: Diagnosis not present

## 2021-04-29 DIAGNOSIS — Z0000 Encounter for general adult medical examination without abnormal findings: Secondary | ICD-10-CM | POA: Diagnosis not present

## 2021-04-29 DIAGNOSIS — Z113 Encounter for screening for infections with a predominantly sexual mode of transmission: Secondary | ICD-10-CM | POA: Diagnosis not present

## 2021-04-29 DIAGNOSIS — Z3040 Encounter for surveillance of contraceptives, unspecified: Secondary | ICD-10-CM | POA: Diagnosis not present

## 2021-04-29 DIAGNOSIS — Z124 Encounter for screening for malignant neoplasm of cervix: Secondary | ICD-10-CM | POA: Diagnosis not present

## 2021-04-29 DIAGNOSIS — Z6825 Body mass index (BMI) 25.0-25.9, adult: Secondary | ICD-10-CM | POA: Diagnosis not present

## 2021-05-28 ENCOUNTER — Telehealth: Payer: Self-pay | Admitting: Internal Medicine

## 2021-05-28 NOTE — Telephone Encounter (Signed)
Copied from CRM [id number]. Topic: General - Inquiry >> May 27, 2021 12:23 PM Daphine Deutscher D wrote: Reason for CRM: Pt took a covid test and it was positive.  She has sinus pain, headache, congestion and fever  She wants to know if there is anything you can prescribe to help her feel better  [facility]     CB#  [phone]

## 2021-05-29 NOTE — Telephone Encounter (Signed)
Pt returned call to speak with clinic, called office twice. Pt has questions

## 2021-05-29 NOTE — Telephone Encounter (Signed)
Contacted pt to go over provider response pt didn't answer left a detailed vm making pt aware and if she has any questions or concerns to give us a call  

## 2021-05-29 NOTE — Telephone Encounter (Signed)
Tried contacting pt to schedule a virtual appt with [place] at [facility]. Pt didn't answer lvm asking pt to give me a call back. Will forward to covering provider for advice

## 2021-05-29 NOTE — Telephone Encounter (Signed)
She can use OTC Sudafed, Tylenol. She needs a visit to discuss the oral antiviral COVID medications if she is interested and assess for shortness of breath.

## 2021-05-30 NOTE — Telephone Encounter (Signed)
Returned pt call pt didn't answer lvm   Sent a CRM and to NT to give pt provider response when they call back

## 2021-06-05 NOTE — Telephone Encounter (Signed)
Pt returning call from 05/28/21 attempt to reach.  Message from Dr. Laural Benes read to pt. Pt states she is still testing positive. Reports "Feels like I have a sinus infection." Reports dry cough, sore throat, nose congested "Stuffy." Denies fever, no SOB. States she has been taking Mucinex, Sudafed, ineffective. Please advise further.

## 2021-06-09 NOTE — Telephone Encounter (Signed)
Pt is scheduled a virtual appt with TOnya for 8/2 at 930

## 2021-06-10 ENCOUNTER — Telehealth: Payer: Self-pay | Admitting: Nurse Practitioner

## 2021-06-10 ENCOUNTER — Other Ambulatory Visit: Payer: Self-pay

## 2021-06-10 ENCOUNTER — Ambulatory Visit: Payer: Medicaid Other | Attending: Nurse Practitioner | Admitting: Nurse Practitioner

## 2021-06-10 NOTE — Telephone Encounter (Signed)
Scheduled for a video visit this morning. Did not show connected through my chart. Attempted to call x 2 attempts to do a telephone visit and there was no answer. May call the office to be rescheduled.

## 2021-06-10 NOTE — Progress Notes (Signed)
No show--erroneous encounter 

## 2021-06-27 ENCOUNTER — Ambulatory Visit (INDEPENDENT_AMBULATORY_CARE_PROVIDER_SITE_OTHER): Payer: Medicaid Other

## 2021-06-27 ENCOUNTER — Encounter (HOSPITAL_COMMUNITY): Payer: Self-pay | Admitting: Emergency Medicine

## 2021-06-27 ENCOUNTER — Ambulatory Visit (HOSPITAL_COMMUNITY)
Admission: EM | Admit: 2021-06-27 | Discharge: 2021-06-27 | Disposition: A | Discharge status: Home / Self Care | Payer: Medicaid Other | Attending: Internal Medicine | Admitting: Internal Medicine

## 2021-06-27 ENCOUNTER — Other Ambulatory Visit: Payer: Self-pay

## 2021-06-27 DIAGNOSIS — S9032XA Contusion of left foot, initial encounter: Secondary | ICD-10-CM

## 2021-06-27 DIAGNOSIS — M79672 Pain in left foot: Secondary | ICD-10-CM

## 2021-06-27 MED ORDER — IBUPROFEN 600 MG PO TABS: 600.0000 mg | ORAL_TABLET | Freq: Three times a day (TID) | ORAL | 0 refills | Status: DC | PRN
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
## 2021-06-27 NOTE — Discharge Instructions (Signed)
Gentle range of motion exercises Icing of the left foot Take ibuprofen as needed for pain Your x-rays negative for fracture Return to urgent care if you have any further concerns.
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
## 2021-10-09 ENCOUNTER — Encounter (HOSPITAL_COMMUNITY): Payer: Self-pay | Admitting: Emergency Medicine

## 2021-10-09 ENCOUNTER — Telehealth (HOSPITAL_COMMUNITY): Payer: Self-pay

## 2021-10-09 ENCOUNTER — Ambulatory Visit (HOSPITAL_COMMUNITY)
Admission: EM | Admit: 2021-10-09 | Discharge: 2021-10-09 | Disposition: A | Discharge status: Home / Self Care | Payer: Medicaid Other

## 2021-10-09 ENCOUNTER — Other Ambulatory Visit: Payer: Self-pay

## 2021-10-09 DIAGNOSIS — J0100 Acute maxillary sinusitis, unspecified: Secondary | ICD-10-CM | POA: Diagnosis not present

## 2021-10-09 MED ORDER — IBUPROFEN 800 MG PO TABS: 800.0000 mg | ORAL_TABLET | Freq: Three times a day (TID) | ORAL | 0 refills | Status: DC

## 2021-10-09 MED ORDER — AMOXICILLIN-POT CLAVULANATE 875-125 MG PO TABS: 1.0000 | ORAL_TABLET | Freq: Two times a day (BID) | ORAL | 0 refills | Status: DC

## 2021-10-09 MED ORDER — DM-GUAIFENESIN ER 30-600 MG PO TB12: 2.0000 | ORAL_TABLET | Freq: Two times a day (BID) | ORAL | 0 refills | Status: DC

## 2021-10-09 NOTE — Telephone Encounter (Signed)
Notified medication not at pharmacy. Sent to pharmacy

## 2021-10-09 NOTE — ED Triage Notes (Signed)
Friday, patient started feeling bad.  Symptoms have been cough, congestion, runny nose, facial pain, blowing green/yellow from sinus.  Patient reports intermittent fever over the last few days

## 2021-10-09 NOTE — Discharge Instructions (Addendum)
Your symptoms today are being caused by a sinus infection, typically sinus infections are caused by viruses and progressed to a bacterial infection therefore we have to wait for 10 days to see if your body will resolve the infection on its own  In the meantime you may take Mucinex DM to help thin secretions and suppress them, coughing  You may use Flonase every morning to help clear passageways  You may use ibuprofen every 8 hours in addition to Tylenol to help with discomfort  On Tuesday, December 5 you may go to the pharmacy and pick up an antibiotic if you are still having symptoms, take Augmentin twice a day for the next 7 days  You may follow-up with urgent care for persistent or reoccurring symptoms

## 2021-10-09 NOTE — Telephone Encounter (Signed)
Received message from patient, she was confused about when her Flonase,mucinex and motrin would be called into her pharmacy. I have advise patient the medication will be called in today. Pt verbalizes understanding at this time.

## 2021-10-09 NOTE — ED Provider Notes (Signed)
[facility]    CSN: [id number] Arrival date & time: 10/09/21  1033      History   Chief Complaint Chief Complaint  Patient presents with   URI    HPI Lauren [name] is a 25 y.o. female.    Has attempted use of tylenol, coricidin, not helpful. known sick contacts of flu, URI. No pertinent medical history.   Past Medical History:  Diagnosis Date   Dysrhythmia    Migraines     Patient Active Problem List   Diagnosis Date Noted   Breast pain, right 01/06/2021   Palpitations 01/06/2021   Anxiety and depression 01/06/2021   Vapes nicotine containing substance 01/06/2021   Myopia of both eyes 01/06/2021   Tetanus, diphtheria, and acellular pertussis (Tdap) vaccination declined 01/06/2021   Major depressive disorder, recurrent episode, moderate (HCC) 01/06/2021   Generalized anxiety disorder 01/06/2021   Migraine without aura and without status migrainosus, not intractable 02/26/2015   Episodic tension-type headache, not intractable 02/26/2015   Poor sleep hygiene 02/26/2015   Presence of subdermal contraceptive implant 03/08/2014   Irregular menses 03/08/2014   Acne 03/08/2014   Chronic headaches 10/28/2013   Sleep disorder 09/21/2013   ADHD (attention deficit hyperactivity disorder), 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   Problems with learning 09/21/2013    Past Surgical History:  Procedure Laterality Date   ADENOIDECTOMY W/ MYRINGOTOMY  6733   25 Years old    TONSILLECTOMY  443   25 Years old    TYMPANOSTOMY TUBE PLACEMENT Bilateral 1999, 20052, 6358   27 weeks old, 25 year old and 25 years old    WISDOM TOOTH EXTRACTION  5164   25 Years old     OB History     Gravida  2   Para  0   Term  0   Preterm  0   AB  1   Living  0      SAB  1   IAB  0   Ectopic  0   Multiple  0   Live Births  0            Home Medications    Prior to Admission medications   Medication Sig Start Date End Date Taking? Authorizing Provider   loratadine-pseudoephedrine (CLARITIN-D 12-HOUR) 5-120 MG tablet Take by mouth. 02/03/20  Yes [provider]  ibuprofen (ADVIL) 600 MG tablet Take 1 tablet (600 mg total) by mouth every 8 (eight) hours as needed. 06/27/21   Lamptey, Britta Mccreedy, MD  misoprostol (CYTOTEC) 200 MCG tablet Take 3 tablets (600 mcg total) by mouth once for 1 dose. Place all 3 tablets in your cheek at one time and let them soften, then swallow them. Patient not taking: Reported on 10/09/2021 04/05/21 04/05/21  Hurshel Party, CNM  promethazine (PHENERGAN) 12.5 MG tablet Take 1-2 tablets (12.5-25 mg total) by mouth every 6 (six) hours as needed for nausea or vomiting. Patient not taking: Reported on 10/09/2021 04/05/21   Hurshel Party, CNM  traMADol (ULTRAM) 50 MG tablet Take 1-2 tablets (50-100 mg total) by mouth every 6 (six) hours as needed. Patient not taking: Reported on 10/09/2021 04/05/21   Hurshel Party, CNM    Family History Family History  Problem Relation Age of Onset   Migraines Mother    Menstrual problems Mother    Diabetes Mother    Anxiety disorder Mother        xanax   Depression Mother  prozac   Thyroid disease Sister    Diabetes Maternal Grandmother    Dementia Maternal Grandmother        Died at 42   Diabetes Maternal Grandfather    Emphysema Maternal Grandfather        Died at 70   Febrile seizures Brother        Died at the age of 29 years old   Pneumonia Paternal Grandmother        Died at 46   Heart failure Paternal Grandfather        Died at 6    Social History Social History   Tobacco Use   Smoking status: Former    Types: Cigarettes    Quit date: 2021    Years since quitting: 1.9   Smokeless tobacco: Never   Tobacco comments:    Mom smokes   Advertising account planner   Vaping Use: Some days   Substances: Nicotine  Substance Use Topics   Alcohol use: Not Currently    Comment: occ   Drug use: No     Allergies   Codeine, Prednisone, Septra  [bactrim], Sulfa antibiotics, and Other   Review of Systems Review of Systems  Constitutional:  Positive for chills and fever. Negative for activity change, appetite change, diaphoresis, fatigue and unexpected weight change.  HENT:  Positive for congestion, rhinorrhea, sinus pressure and sinus pain. Negative for dental problem, drooling, ear discharge, ear pain, facial swelling, hearing loss, mouth sores, nosebleeds, postnasal drip, sneezing, sore throat, tinnitus, trouble swallowing and voice change.   Respiratory:  Positive for cough. Negative for apnea, choking, chest tightness, shortness of breath, wheezing and stridor.   Cardiovascular: Negative.   Gastrointestinal:  Positive for nausea. Negative for abdominal distention, abdominal pain, anal bleeding, blood in stool, constipation, diarrhea, rectal pain and vomiting.  Skin: Negative.   Neurological:  Positive for headaches. Negative for dizziness, tremors, seizures, syncope, facial asymmetry, speech difficulty, weakness, light-headedness and numbness.    Physical Exam Triage Vital Signs ED Triage Vitals  Enc Vitals Group     BP 10/09/21 1234 121/89     Pulse Rate 10/09/21 1234 87     Resp 10/09/21 1234 20     Temp 10/09/21 1234 98.7 F (37.1 C)     Temp Source 10/09/21 1234 Oral     SpO2 10/09/21 1234 97 %     Weight --      Height --      Head Circumference --      Peak Flow --      Pain Score 10/09/21 1231 6     Pain Loc --      Pain Edu? --      Excl. in GC? --    No data found.  Updated Vital Signs BP 121/89 (BP Location: Left Arm)   Pulse 87   Temp 98.7 F (37.1 C) (Oral)   Resp 20   LMP 09/22/2021   SpO2 97%   Visual Acuity Right Eye Distance:   Left Eye Distance:   Bilateral Distance:    Right Eye Near:   Left Eye Near:    Bilateral Near:     Physical Exam Constitutional:      Appearance: Normal appearance. She is normal weight.  HENT:     Head: Normocephalic.     Right Ear: Tympanic membrane,  ear canal and external ear normal.     Left Ear: Tympanic membrane, ear canal and external ear normal.  Nose: Congestion present.     Right Sinus: Maxillary sinus tenderness present.     Left Sinus: Maxillary sinus tenderness present.     Mouth/Throat:     Mouth: Mucous membranes are moist.     Pharynx: Posterior oropharyngeal erythema present.  Cardiovascular:     Rate and Rhythm: Normal rate and regular rhythm.     Pulses: Normal pulses.     Heart sounds: Normal heart sounds.  Pulmonary:     Effort: Pulmonary effort is normal.     Breath sounds: Wheezing present.  Musculoskeletal:     Cervical back: Normal range of motion and neck supple.  Skin:    General: Skin is warm and dry.  Neurological:     Mental Status: She is alert and oriented to person, place, and time. Mental status is at baseline.  Psychiatric:        Mood and Affect: Mood normal.        Behavior: Behavior normal.     UC Treatments / Results  Labs (all labs ordered are listed, but only abnormal results are displayed) Labs Reviewed - No data to display  EKG   Radiology No results found.  Procedures Procedures (including critical care time)  Medications Ordered in UC Medications - No data to display  Initial Impression / Assessment and Plan / UC Course  I have reviewed the triage vital signs and the nursing notes.  Pertinent labs & imaging results that were available during my care of the patient were reviewed by me and considered in my medical decision making (see chart for details).  Nonrecurrent maxillary sinusitis  discused etiology of symptoms, timeline and possible resolution with patient, will manage conservatively of illness  1.  Mucinex DM 1200 mg-30 twice daily 2. Flonase 50 mcg 1 spray each nare daily 3.  Augmentin 875-125 mg twice daily for 7 days to be started and picked up on December 5 4.  Urgent care follow-up as needed Final Clinical Impressions(s) / UC Diagnoses   Final  diagnoses:  Acute non-recurrent maxillary sinusitis     Discharge Instructions      Your symptoms today are being caused by a sinus infection, typically sinus infections are caused by viruses and progressed to a bacterial infection therefore we have to wait for 10 days to see if your body will resolve the infection on its own  In the meantime you may take Mucinex DM to help thin secretions and suppress them, coughing  You may use Flonase every morning to help clear passageways  You may use ibuprofen every 8 hours in addition to Tylenol to help with discomfort  On Tuesday, December 5 you may go to the pharmacy and pick up an antibiotic if you are still having symptoms, take Augmentin twice a day for the next 7 days  You may follow-up with urgent care for persistent or reoccurring symptoms   ED Prescriptions   None    PDMP not reviewed this encounter.   Valinda Hoar, NP 10/09/21 1326

## 2021-10-15 IMAGING — US US OB TRANSVAGINAL
1 series · 15 of 22 positions shown · non-contrast
Comparison: March 10, 2021

CLINICAL DATA: Heavy vaginal bleeding.

EXAM:
OBSTETRIC <14 WK US AND TRANSVAGINAL OB US
TECHNIQUE: Both transabdominal and transvaginal ultrasound examinations were
performed for complete evaluation of the gestation as well as the
maternal uterus, adnexal regions, and pelvic cul-de-sac.
Transvaginal technique was performed to assess early pregnancy.

[Series 1: us ob transvaginal · 15 of 22 slices shown]
[im 1/22]
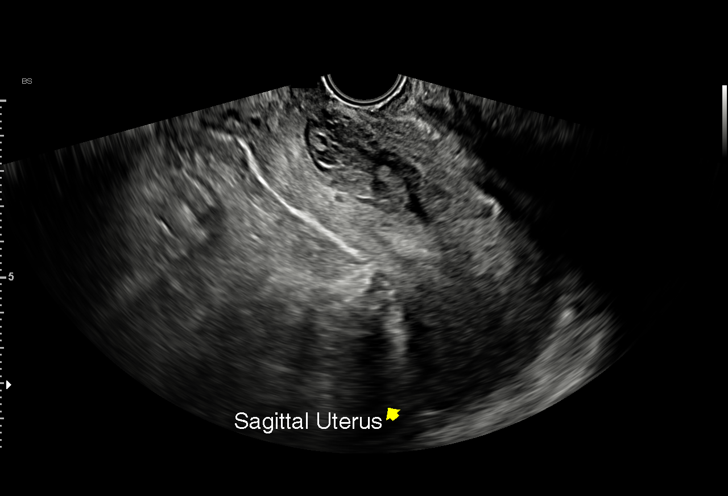
[im 3/22]
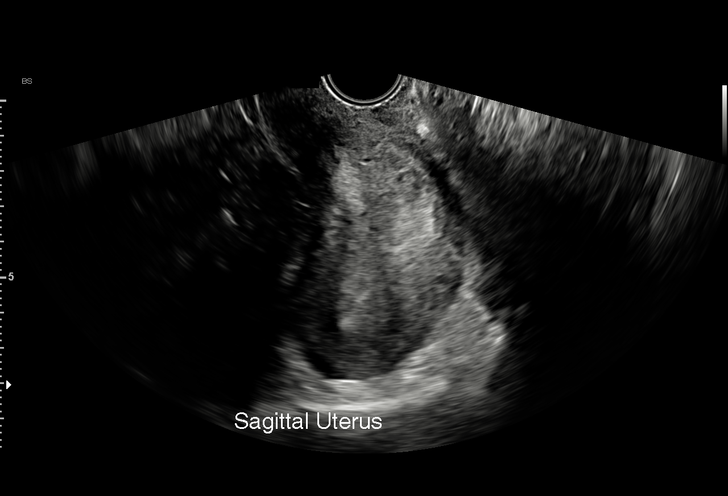
[im 4/22]
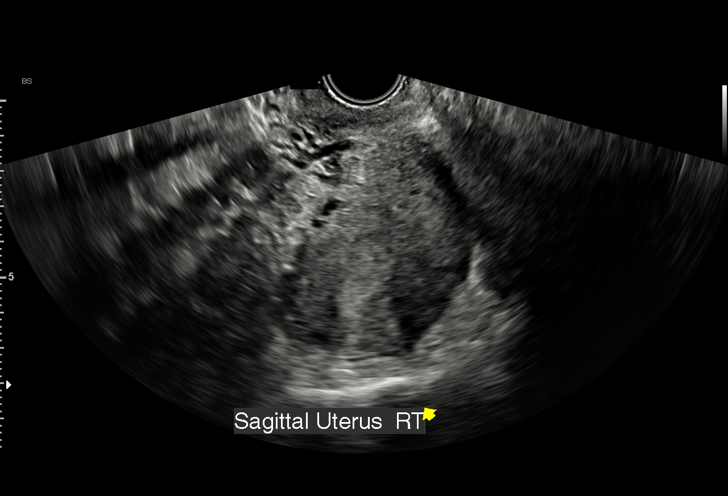
[im 6/22]
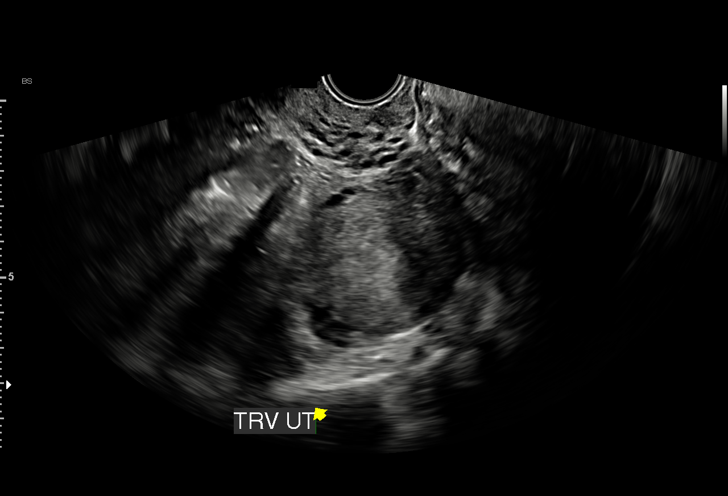
[im 7/22]
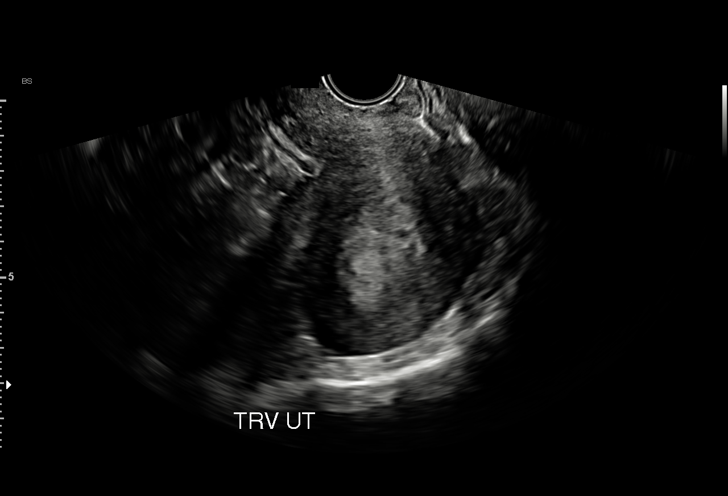
[im 9/22]
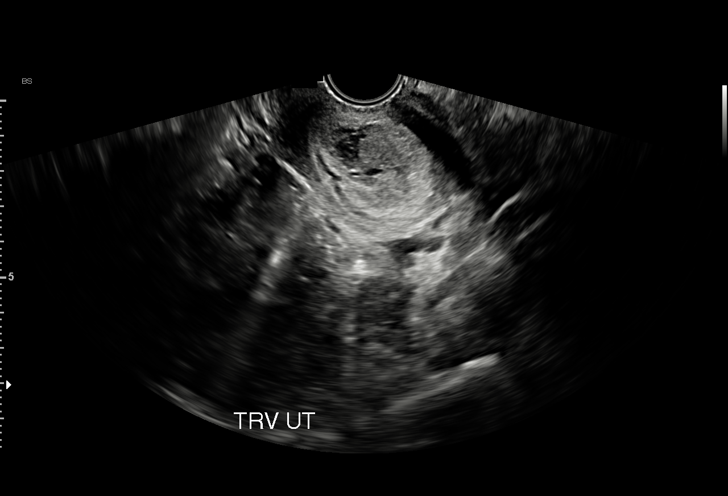
[im 10/22]
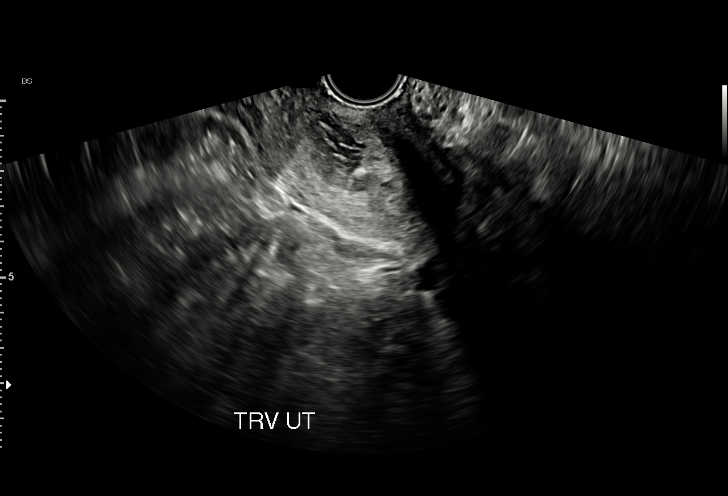
[im 12/22]
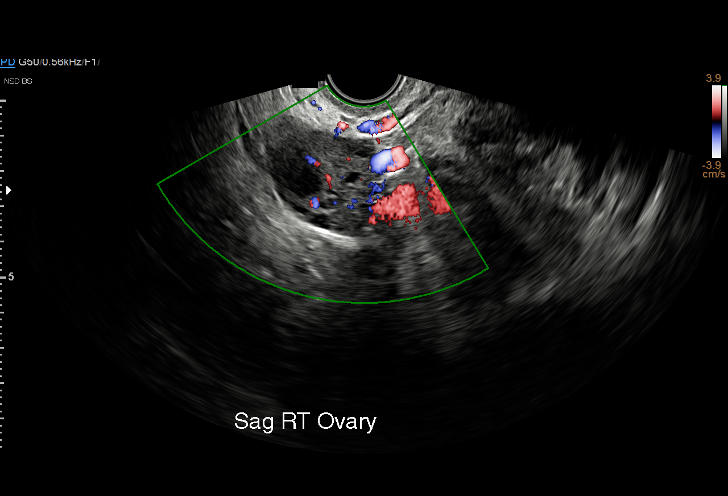
[im 13/22]
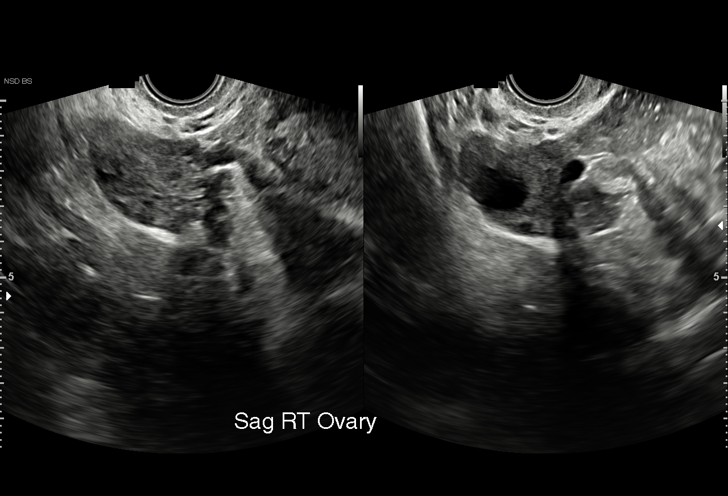
[im 14/22]
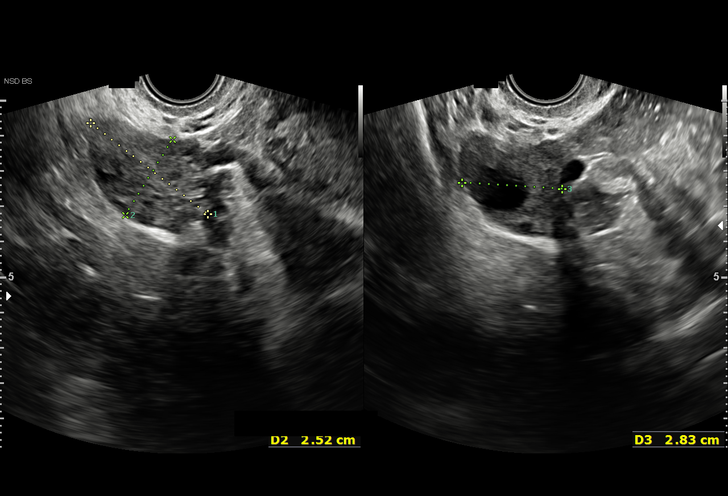
[im 16/22]
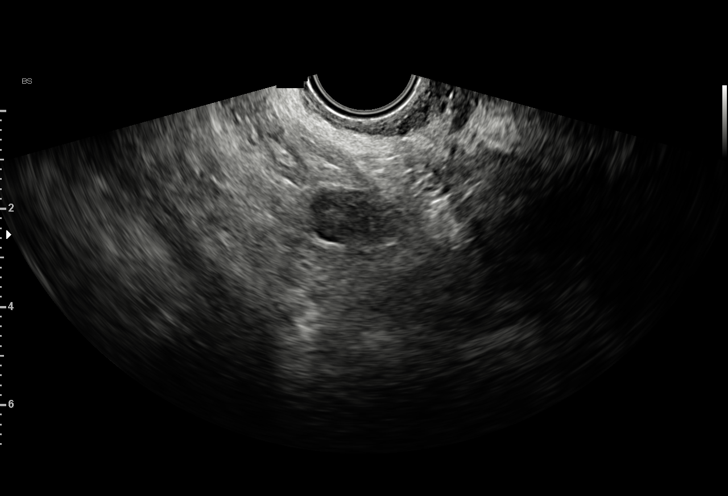
[im 17/22]
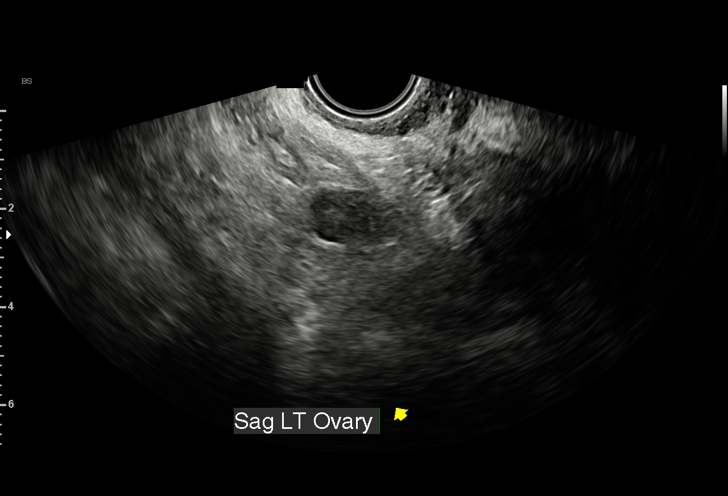
[im 19/22]
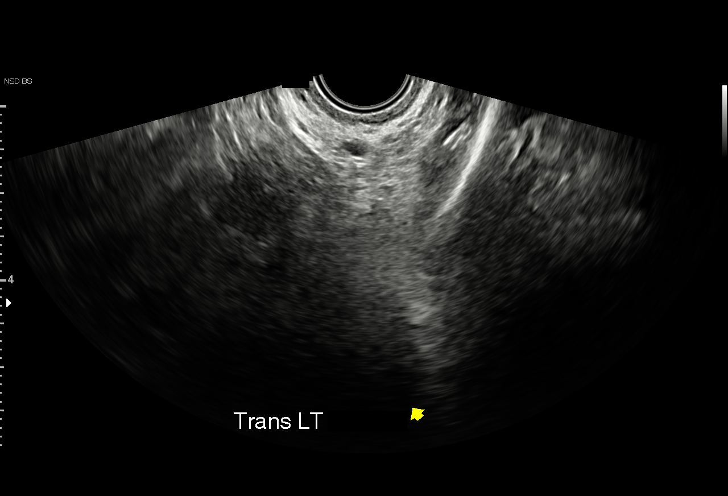
[im 20/22]
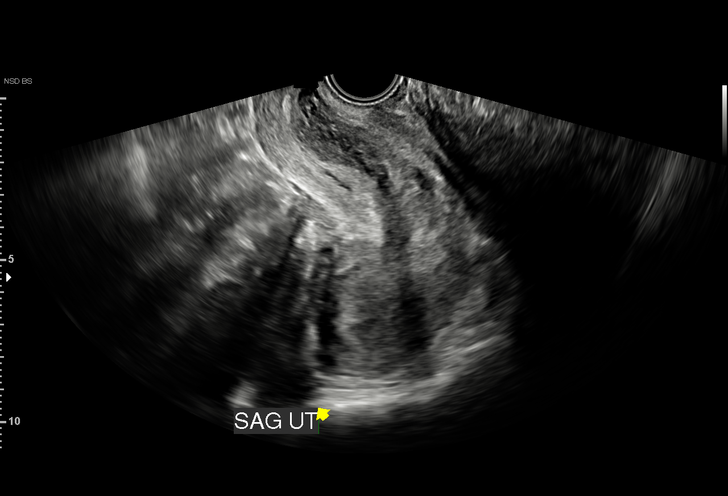
[im 22/22]
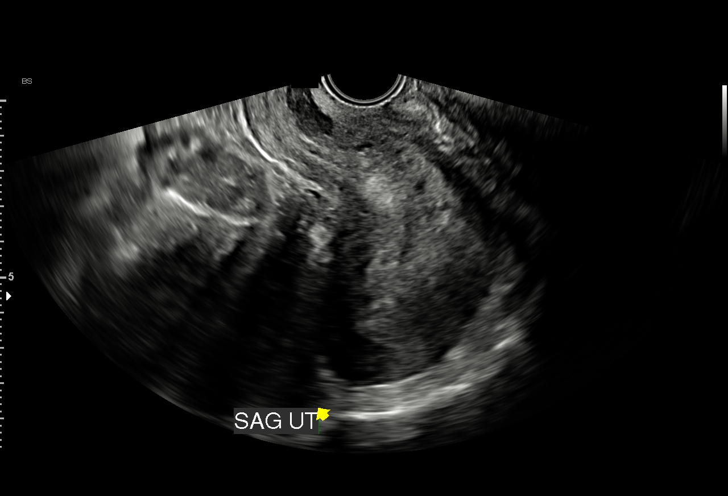

[15 of 22 positions shown; findings below may reference images not displayed]

FINDINGS: Intrauterine gestational sac: None

Yolk sac:  Not Visualized.

Embryo:  Not Visualized.

Maternal uterus/adnexae:

Large amount of blood products in the lower uterine segment/cervix.

Corpus luteal cyst on the right ovary. The left ovary is not
visualized.
IMPRESSION: Findings consistent with failed pregnancy.

Blood products within the lower uterine segment/cervix.

## 2022-01-12 ENCOUNTER — Ambulatory Visit: Payer: Self-pay | Admitting: *Deleted

## 2022-01-12 NOTE — Telephone Encounter (Signed)
Summary: STI Advice  ? Pt is calling to ask advice- Pt has scheduled appt for Wednesday for STI testing. Pt will be starting her period tomorrow. Will the testing still be able to be conducted even though the patient is on her period?   ?  ? ?Called patient to review questions. Reviewed with patient to keep upcoming appt on 01/14/22. Testing can be completed while she is on her period. Patient reports she has not started period yet but may start prior to appt. Please advise if patient should reschedule appt.  ? ? ? ? ? ? ?Reason for Disposition ? General information question, no triage required and triager able to answer question ? ?Answer Assessment - Initial Assessment Questions ?1. REASON FOR CALL or QUESTION: "What is your reason for calling today?" or "How can I best help you?" or "What question do you have that I can help answer?" ?    Can patient still be tested for STD while she is on her period? ? ?Protocols used: Information Only Call - No Triage-A-AH ? ?

## 2022-01-14 ENCOUNTER — Ambulatory Visit: Payer: Medicaid Other | Attending: Physician Assistant | Admitting: Physician Assistant

## 2022-01-14 ENCOUNTER — Encounter: Payer: Self-pay | Admitting: Physician Assistant

## 2022-01-14 ENCOUNTER — Other Ambulatory Visit: Payer: Self-pay

## 2022-01-14 ENCOUNTER — Other Ambulatory Visit (HOSPITAL_COMMUNITY)
Admission: RE | Admit: 2022-01-14 | Discharge: 2022-01-14 | Disposition: A | Discharge status: Home / Self Care | Payer: Medicaid Other | Source: Ambulatory Visit | Attending: Physician Assistant | Admitting: Physician Assistant

## 2022-01-14 VITALS — BP 121/81 | HR 68 | Wt 177.8 lb

## 2022-01-14 DIAGNOSIS — N912 Amenorrhea, unspecified: Secondary | ICD-10-CM | POA: Diagnosis not present

## 2022-01-14 DIAGNOSIS — Z8619 Personal history of other infectious and parasitic diseases: Secondary | ICD-10-CM

## 2022-01-14 DIAGNOSIS — Z113 Encounter for screening for infections with a predominantly sexual mode of transmission: Secondary | ICD-10-CM | POA: Insufficient documentation

## 2022-01-14 LAB — POCT URINE PREGNANCY: Preg Test, Ur: NEGATIVE

## 2022-01-14 NOTE — Progress Notes (Signed)
Patient ID: Lauren [name], female   DOB: August 10, 1996, 26 y.o.   MRN: [id number] ? ? ?Lauren [name], is a 26 y.o. female ? ?KCL:[id number] ? ?[id number] ? ?DOB - 1996-02-15 ? ?Chief Complaint  ?Patient presents with  ? STI Testing   ?    ? ?Subjective:  ? ?Lauren [name] is a 26 y.o. female here today for concerns for STD.  Just found out her last BF was being unfaithful and wants to getSTD testing.  H/o gonorrhea last year.  No pelvic pain, vaginal discharge, urinary symptoms or fevers.  No N.  She was last with him 11/2021.  She is 4 days late for her period but she has had a period since the last time she was SA.  LMP in Feb.  She was due to start 3-4 days ago.   ? ?No problems updated. ? ?ALLERGIES: ?Allergies  ?Allergen Reactions  ? Codeine Palpitations and Other (See Comments)  ?  Speeds up heart rate  ? Prednisone Other (See Comments)  ?  Extreme crying ?Back breaks out   ? Septra [Bactrim] Nausea And Vomiting  ? Sulfa Antibiotics Anaphylaxis and Nausea And Vomiting  ? Other Itching, Swelling and Rash  ?  TACO SEASONING ([employer] FROM [employer])  ? ? ?PAST MEDICAL HISTORY: ?Past Medical History:  ?Diagnosis Date  ? Dysrhythmia   ? Migraines   ? ? ?MEDICATIONS AT HOME: ?Prior to Admission medications   ?Medication Sig Start Date End Date Taking? Authorizing Provider  ?loratadine-pseudoephedrine (CLARITIN-D 12-HOUR) 5-120 MG tablet Take by mouth. 02/03/20  Yes [provider]  ?amoxicillin-clavulanate (AUGMENTIN) 875-125 MG tablet Take 1 tablet by mouth every 12 (twelve) hours. ?Patient not taking: Reported on 01/14/2022 10/13/21   Valinda Hoar, NP  ?dextromethorphan-guaiFENesin [facility] DM) 30-600 MG 12hr tablet Take 2 tablets by mouth 2 (two) times daily. ?Patient not taking: Reported on 01/14/2022 10/09/21   Valinda Hoar, NP  ?ibuprofen (ADVIL) 800 MG tablet Take 1 tablet (800 mg total) by mouth 3 (three) times daily. ?Patient not taking: Reported on 01/14/2022 10/09/21   Valinda Hoar, NP   ?misoprostol (CYTOTEC) 200 MCG tablet Take 3 tablets (600 mcg total) by mouth once for 1 dose. Place all 3 tablets in your cheek at one time and let them soften, then swallow them. ?Patient not taking: Reported on 10/09/2021 04/05/21 04/05/21  Hurshel Party, CNM  ?promethazine (PHENERGAN) 12.5 MG tablet Take 1-2 tablets (12.5-25 mg total) by mouth every 6 (six) hours as needed for nausea or vomiting. ?Patient not taking: Reported on 10/09/2021 04/05/21   Hurshel Party, CNM  ?traMADol (ULTRAM) 50 MG tablet Take 1-2 tablets (50-100 mg total) by mouth every 6 (six) hours as needed. ?Patient not taking: Reported on 10/09/2021 04/05/21   Sharen Counter A, CNM  ? ? ?ROS: ?Neg HEENT ?Neg resp ?Neg cardiac ?Neg GI ?Neg GU ?Neg MS ?Neg psych ?Neg neuro ? ?Objective:  ? ?Vitals:  ? 01/14/22 0852  ?BP: 121/81  ?Pulse: 68  ?SpO2: 95%  ?Weight: 177 lb 12.8 oz (80.6 kg)  ? ?Exam ?General appearance : Awake, alert, not in any distress. Speech Clear. Not toxic looking ?HEENT: Atraumatic and Normocephalic ?Neck: Supple, no JVD. No cervical lymphadenopathy.  ?Chest: Good air entry bilaterally, CTAB.  No rales/rhonchi/wheezing ?CVS: S1 S2 regular, no murmurs.  ?Extremities: B/L Lower Ext shows no edema, both legs are warm to touch ?Neurology: Awake alert, and oriented X 3, CN II-XII intact, Non focal ?Skin: No Rash ? ?  Data Review ?No results found for: HGBA1C ? ?Assessment & Plan  ? ?1. H/O gonorrhea ?Safe sex practices reviewed  ?- Cervicovaginal ancillary only ?- HIV antibody (with reflex) ?- RPR w/reflex to TrepSure ? ?2. Screening for STD (sexually transmitted disease) ?- Cervicovaginal ancillary only ?- RPR w/reflex to TrepSure ? ?3. Amenorrhea ?- POCT urine pregnancy is negative ? ? ? ?Patient have been counseled extensively about nutrition and exercise. Other issues discussed during this visit include: low cholesterol diet, weight control and daily exercise, foot care, annual eye examinations at Ophthalmology,  importance of adherence with medications and regular follow-up. We also discussed long term complications of uncontrolled diabetes and hypertension.  ? ?Return if symptoms worsen or fail to improve. ? ?The patient was given clear instructions to go to ER or return to medical center if symptoms don't improve, worsen or new problems develop. The patient verbalized understanding. The patient was told to call to get lab results if they haven't heard anything in the next week.  ? ? ? ? ?Georgian Co, PA-C ?[facility] ?[place], [place] ?[phone]   ?01/14/2022, 9:22 AM  ?

## 2022-01-14 NOTE — Telephone Encounter (Signed)
Pt is in the office  ?

## 2022-01-14 NOTE — Patient Instructions (Signed)
Safe Sex Practicing safe sex means taking steps before and during sex to reduce your risk of: Getting an STI (sexually transmitted infection). Giving your partner an STI. Unwanted or unplanned pregnancy. How to practice safe sex Ways you can practice safe sex  Limit your sexual partners to only one partner who is having sex with only you. Avoid using alcohol and drugs before having sex. Alcohol and drugs can affect your judgment. Before having sex with a new partner: Talk to your partner about past partners, past STIs, and drug use. Get screened for STIs and discuss the results with your partner. Ask your partner to get screened too. Check your body regularly for sores, blisters, rashes, or unusual discharge. If you notice any of these problems, visit your health care provider. Avoid sexual contact if you have symptoms of an infection or you are being treated for an STI. While having sex, use a condom. Make sure to: Use a condom every time you have vaginal, oral, or anal sex. Both females and males should wear condoms during oral sex. Keep condoms in place from the beginning to the end of sexual activity. Use a latex condom, if possible. Latex condoms offer the best protection. Use only water-based lubricants with a condom. Using petroleum-based lubricants or oils will weaken the condom and increase the chance that it will break. Ways your health care provider can help you practice safe sex  See your health care provider for regular screenings, exams, and tests for STIs. Talk with your health care provider about what kind of birth control (contraception) is best for you. Get vaccinated against hepatitis B and human papillomavirus (HPV). If you are at risk of being infected with HIV (human immunodeficiency virus), talk with your health care provider about taking a prescription medicine to prevent HIV infection. You are at risk for HIV if you: Are a man who has sex with other men. Are  sexually active with more than one partner. Take drugs by injection. Have a sex partner who has HIV. Have unprotected sex. Have sex with someone who has sex with both men and women. Have had an STI. Follow these instructions at home: Take over-the-counter and prescription medicines only as told by your health care provider. Keep all follow-up visits. This is important. Where to find more information Centers for Disease Control and Prevention: www.cdc.gov Planned Parenthood: www.plannedparenthood.org Office on Women's Health: www.womenshealth.gov Summary Practicing safe sex means taking steps before and during sex to reduce your risk getting an STI, giving your partner an STI, and having an unwanted or unplanned pregnancy. Before having sex with a new partner, talk to your partner about past partners, past STIs, and drug use. Use a condom every time you have vaginal, oral, or anal sex. Both females and males should wear condoms during oral sex. Check your body regularly for sores, blisters, rashes, or unusual discharge. If you notice any of these problems, visit your health care provider. See your health care provider for regular screenings, exams, and tests for STIs. This information is not intended to replace advice given to you by your health care provider. Make sure you discuss any questions you have with your health care provider. Document Revised: 04/01/2020 Document Reviewed: 04/01/2020 Elsevier Patient Education  2022 Elsevier Inc.  

## 2022-01-15 ENCOUNTER — Ambulatory Visit: Payer: Self-pay | Admitting: *Deleted

## 2022-01-15 LAB — CERVICOVAGINAL ANCILLARY ONLY
Bacterial Vaginitis (gardnerella): NEGATIVE
Candida Glabrata: NEGATIVE
Candida Vaginitis: POSITIVE — AB
Chlamydia: NEGATIVE
Comment: NEGATIVE
Comment: NEGATIVE
Comment: NEGATIVE
Comment: NEGATIVE
Comment: NEGATIVE
Comment: NORMAL
Neisseria Gonorrhea: NEGATIVE
Trichomonas: NEGATIVE

## 2022-01-15 NOTE — Telephone Encounter (Signed)
Summary: questions about why she has not started her period yet  ? Pt would like to know what may be keeping her from starting her period?  They are usually on time.  Pt was in clinic yesterday, and they did some testing, but all negative. Pt would like discuss   ?  ? ?Chief Complaint: no period ?Symptoms: just like on period ?Frequency: constant ?Pertinent Negatives: Patient denies  ?Disposition: [] ED /[] Urgent Care (no appt availability in office) / [] Appointment(In office/virtual)/ []  [facility]/ [] Home Care/ [] Refused Recommended Disposition /[] [facility]/ [x]  Follow-up with PCP ?Additional Notes: Pt was seen yesterday by J. Osceola, . She saw on MyChart that tests were negative. 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 She has GYN but states you have to leave a message to make appt and states they never call back. States she has tried several times and wants [facility] to see if appt can be made. Her GYN is  , Dr. (she is unsure of spelling).  ? ?Reason for Disposition ? [1] Recent medical visit within 24 hours AND [2] condition / symptoms SAME (unchanged) AND [3] caller has additional questions triager can answer ? ?Answer Assessment - Initial Assessment Questions ?1. MAIN CONCERN OR SYMPTOM:  "What is your main concern right now?" "What question do you have?" "What's the main symptom you're worried about?" (e.g., breathing difficulty, cough, fever. pain) ?    Period 4 days late ?2. ONSET: "When did the   start?" ?    Did not start ?3. BETTER-SAME-WORSE: "Are you getting better, staying the same, or getting worse compared to how you felt at your last visit to the doctor (most recent medical visit)?" ?    Cramps same for 4 days ?4. VISIT DATE: "When were you seen?" (Date) ?    yesterday ?5. VISIT DOCTOR: "What is the name of the doctor taking care of you now?" ?    Johnson ?6. VISIT DIAGNOSIS:  "What was the main symptom or problem that you were seen for?"  "Were you given a diagnosis?"  ?    Tests were pending yesterday ?7. VISIT MEDICINES: "Did the doctor order any new medicines for you to use?" If Yes, ask: "Have you filled the prescription and started taking the medicine?"  ?    na ?8. NEXT APPOINTMENT: "Have you scheduled a follow-up appointment with your doctor?" ?    no ?9. PAIN: "Is there any pain?" If Yes, ask: "How bad is it?"  (Scale 0-10; or mild, moderate, severe) ?   - NONE (0): no pain ?   - MILD (1-3): doesn't interfere with normal activities  ?   - MODERATE (4-7): interferes with normal activities or awakens from sleep  ?   - SEVERE (8-10): excruciating pain, unable to do any normal activities ?    Feels just like on her period. ?10. FEVER: "Do you have a fever?" If Yes, ask: "What is it, how was it measured  and when did it start?" ?      no ?11. OTHER SYMPTOMS: "Do you have any other symptoms?" ?      No, has a history of two miscarriages. ? ?Protocols used: Recent Medical Visit for Illness Follow-up Call-A-AH ? ?

## 2022-01-16 ENCOUNTER — Other Ambulatory Visit: Payer: Self-pay | Admitting: Physician Assistant

## 2022-01-16 LAB — T PALLIDUM ANTIBODY, EIA: T pallidum Antibody, EIA: NEGATIVE

## 2022-01-16 LAB — HIV ANTIBODY (ROUTINE TESTING W REFLEX): HIV Screen 4th Generation wRfx: NONREACTIVE

## 2022-01-16 LAB — RPR W/REFLEX TO TREPSURE: RPR: NONREACTIVE

## 2022-01-16 MED ORDER — FLUCONAZOLE 150 MG PO TABS: 150.0000 mg | ORAL_TABLET | ORAL | 0 refills | Status: DC

## 2022-01-22 NOTE — Addendum Note (Signed)
Addended by: Jonah Blue B on: 01/22/2022 03:34 PM ? ? Modules accepted: Orders ? ?

## 2022-02-02 ENCOUNTER — Ambulatory Visit: Payer: Self-pay | Admitting: *Deleted

## 2022-02-02 NOTE — Telephone Encounter (Signed)
Summary: stomach discomfort / bloating  ? The patient has experienced stomach discomfort all weekend since 01/30/22  ? ?The patient shares that they're unable to eat without stomach discomfort  ? ?The patient has experienced the feeling that they're about to vomit without vomiting  ? ?The patient has significant stomach bloating as well back discomfort  ? ?Please contact further when possible   ?  ? ?Reason for Disposition ? Black or tarry bowel movements (Exception: chronic-unchanged black-grey bowel movements AND is taking iron pills or Pepto-bismol) ? ?Answer Assessment - Initial Assessment Questions ?1. LOCATION: "Where does it hurt?"  ?    Upper midline above the belly button ?2. RADIATION: "Does the pain shoot anywhere else?" (e.g., chest, back) ?    Sometimes will go around to back ?3. ONSET: "When did the pain begin?" (e.g., minutes, hours or days ago)  ?    1 year- patient has not been diagnosed ?4. SUDDEN: "Gradual or sudden onset?" ?    gradual ?5. PATTERN "Does the pain come and go, or is it constant?" ?   - If constant: "Is it getting better, staying the same, or worsening?"  ?    (Note: Constant means the pain never goes away completely; most serious pain is constant and it progresses)  ?   - If intermittent: "How long does it last?" "Do you have pain now?" ?    (Note: Intermittent means the pain goes away completely between bouts) ?    Comes and goes- pain with eating ?6. SEVERITY: "How bad is the pain?"  (e.g., Scale 1-10; mild, moderate, or severe) ?  - MILD (1-3): doesn't interfere with normal activities, abdomen soft and not tender to touch  ?  - MODERATE (4-7): interferes with normal activities or awakens from sleep, abdomen tender to touch  ?  - SEVERE (8-10): excruciating pain, doubled over, unable to do any normal activities  ?    severe ?7. RECURRENT SYMPTOM: "Have you ever had this type of stomach pain before?" If Yes, ask: "When was the last time?" and "What happened that time?"  ?     Chronic- has not been addressed ?8. CAUSE: "What do you think is causing the stomach pain?" ?    Not sure ?9. RELIEVING/AGGRAVATING FACTORS: "What makes it better or worse?" (e.g., movement, antacids, bowel movement) ?    Eating makes the pain worse ?10. OTHER SYMPTOMS: "Do you have any other symptoms?" (e.g., back pain, diarrhea, fever, urination pain, vomiting) ?      Nausea, dark stool ?11. PREGNANCY: "Is there any chance you are pregnant?" "When was your last menstrual period?" ?      No- LMP- 3/12 ? ?Protocols used: Abdominal Pain - Female-A-AH, Abdominal Pain - Upper-A-AH ? ?

## 2022-02-02 NOTE — Telephone Encounter (Signed)
?  Chief Complaint: abdominal pain ?Symptoms: pain with eating, nausea with eating, dark stool ?Frequency: chronic- getting worse ?Pertinent Negatives: Patient denies back pain, diarrhea, fever, urination pain, vomiting ?Disposition: [x] ED /[] Urgent Care (no appt availability in office) / [] Appointment(In office/virtual)/ []  [facility]/ [] Home Care/ [] Refused Recommended Disposition /[] [facility]/ []  Follow-up with PCP ?Additional Notes: Advised ED for evaluation  ?

## 2022-02-19 ENCOUNTER — Other Ambulatory Visit (HOSPITAL_COMMUNITY)
Admission: RE | Admit: 2022-02-19 | Discharge: 2022-02-19 | Disposition: A | Discharge status: Home / Self Care | Payer: Medicaid Other | Source: Ambulatory Visit | Attending: Physician Assistant | Admitting: Physician Assistant

## 2022-02-19 ENCOUNTER — Encounter: Payer: Self-pay | Admitting: Physician Assistant

## 2022-02-19 ENCOUNTER — Ambulatory Visit: Payer: Medicaid Other | Attending: Physician Assistant | Admitting: Physician Assistant

## 2022-02-19 VITALS — BP 117/79 | HR 64 | Wt 173.2 lb

## 2022-02-19 DIAGNOSIS — R140 Abdominal distension (gaseous): Secondary | ICD-10-CM

## 2022-02-19 DIAGNOSIS — R110 Nausea: Secondary | ICD-10-CM

## 2022-02-19 DIAGNOSIS — R1011 Right upper quadrant pain: Secondary | ICD-10-CM | POA: Diagnosis not present

## 2022-02-19 DIAGNOSIS — Z8619 Personal history of other infectious and parasitic diseases: Secondary | ICD-10-CM | POA: Diagnosis not present

## 2022-02-19 DIAGNOSIS — R109 Unspecified abdominal pain: Secondary | ICD-10-CM | POA: Insufficient documentation

## 2022-02-19 LAB — POCT URINALYSIS DIP (CLINITEK)
Bilirubin, UA: NEGATIVE
Glucose, UA: NEGATIVE mg/dL
Ketones, POC UA: NEGATIVE mg/dL
Leukocytes, UA: NEGATIVE
Nitrite, UA: NEGATIVE
POC PROTEIN,UA: 30 — AB
Spec Grav, UA: >=1.03 — AB (ref 1.010–1.025)
Urobilinogen, UA: 0.2 E.U./dL
pH, UA: 6 (ref 5.0–8.0)

## 2022-02-19 LAB — POCT URINE PREGNANCY: Preg Test, Ur: NEGATIVE

## 2022-02-19 NOTE — Progress Notes (Signed)
Patient ID: Lauren [name], female   DOB: [birth year], 26 y.o.   MRN: [id number] ? ? ?Lauren [name], is a 26 y.o. female ? ?[id number] ? ?[id number] ? ?DOB - June 06, 1996 ? ?Chief Complaint  ?Patient presents with  ? Abdominal Pain  ?    ? ?Subjective:  ? ?Lauren [name] is a 26 y.o. female here today for continued abdominal pain than seems to be worsening.  The pain is mid epigastric and RUQ.  Worse after fatty meals or sometimes any meals.  Nausea w/o vomiting.  No melena/hematochezia.  Uses condoms.  No vaginal discharge.  Just finished her period.  No dysuria.  No brown urine.  No fever.   ? ?She does have h/o gonorrhea that was treated about 1 year ago.  She has been tested subsequently and results were negative.   ? ?Her whole family has a h/o gallstones on both sides of the family ? ?No problems updated. ? ?ALLERGIES: ?Allergies  ?Allergen Reactions  ? Codeine Palpitations and Other (See Comments)  ?  Speeds up heart rate  ? Prednisone Other (See Comments)  ?  Extreme crying ?Back breaks out   ? Septra [Bactrim] Nausea And Vomiting  ? Sulfa Antibiotics Anaphylaxis and Nausea And Vomiting  ? Other Itching, Swelling and Rash  ?  TACO SEASONING (MARCUM BRAND FROM [employer])  ? ? ?PAST MEDICAL HISTORY: ?Past Medical History:  ?Diagnosis Date  ? Dysrhythmia   ? Migraines   ? ? ?MEDICATIONS AT HOME: ?Prior to Admission medications   ?Medication Sig Start Date End Date Taking? Authorizing Provider  ?fluconazole (DIFLUCAN) 150 MG tablet Take 1 tablet (150 mg total) by mouth once a week. ?Patient not taking: Reported on 02/19/2022 01/16/22   Anders Simmonds, PA-C  ?loratadine-pseudoephedrine (CLARITIN-D 12-HOUR) 5-120 MG tablet Take by mouth. 02/03/20  Yes [provider]  ?amoxicillin-clavulanate (AUGMENTIN) 875-125 MG tablet Take 1 tablet by mouth every 12 (twelve) hours. ?Patient not taking: Reported on 01/14/2022 10/13/21   Valinda Hoar, NP  ?dextromethorphan-guaiFENesin [facility] DM) 30-600 MG 12hr  tablet Take 2 tablets by mouth 2 (two) times daily. ?Patient not taking: Reported on 01/14/2022 10/09/21   Valinda Hoar, NP  ?ibuprofen (ADVIL) 800 MG tablet Take 1 tablet (800 mg total) by mouth 3 (three) times daily. ?Patient not taking: Reported on 01/14/2022 10/09/21   Valinda Hoar, NP  ?misoprostol (CYTOTEC) 200 MCG tablet Take 3 tablets (600 mcg total) by mouth once for 1 dose. Place all 3 tablets in your cheek at one time and let them soften, then swallow them. ?Patient not taking: Reported on 10/09/2021 04/05/21 04/05/21  Hurshel Party, CNM  ?promethazine (PHENERGAN) 12.5 MG tablet Take 1-2 tablets (12.5-25 mg total) by mouth every 6 (six) hours as needed for nausea or vomiting. ?Patient not taking: Reported on 10/09/2021 04/05/21   Hurshel Party, CNM  ?traMADol (ULTRAM) 50 MG tablet Take 1-2 tablets (50-100 mg total) by mouth every 6 (six) hours as needed. ?Patient not taking: Reported on 10/09/2021 04/05/21   Sharen Counter A, CNM  ? ? ?ROS: ?Neg HEENT ?Neg resp ?Neg cardiac ?Neg MS ?Neg psych ?Neg neuro ? ?Objective:  ? ?Vitals:  ? 02/19/22 1024  ?BP: 117/79  ?Pulse: 64  ?SpO2: 94%  ?Weight: 173 lb 3.2 oz (78.6 kg)  ? ?Exam ?General appearance : Awake, alert, not in any distress. Speech Clear. Not toxic looking ?HEENT: Atraumatic and Normocephalic ?Neck: Supple, no JVD. No cervical lymphadenopathy.  ?Chest: Good air entry  bilaterally, CTAB.  No rales/rhonchi/wheezing ?CVS: S1 S2 regular, no murmurs.  ?Abdomen: Bowel sounds present, abdomen non-distended, there is tenderness in the midepigastric region and RUQ but neg Murphy's sign.  Otherwise abdominal exam unremarkable ?Extremities: B/L Lower Ext shows no edema, both legs are warm to touch ?Neurology: Awake alert, and oriented X 3, CN II-XII intact, Non focal ?Skin: No Rash ? ?Data Review ?No results found for: HGBA1C ? ?Assessment & Plan  ? ?1. Right upper quadrant abdominal pain ?- POCT URINALYSIS DIP (CLINITEK) ?- POCT urine  pregnancy ?- Cervicovaginal ancillary only ?- Comprehensive metabolic panel ?- CBC with Differential/Platelet ?- Lipase ?- US Abdomen Limited RUQ (LIVER/GB); Future ? ?2. Bloated abdomen ?- H. pylori breath test ?- Comprehensive metabolic panel ? ?3. H/O gonorrhea ?Low likelihood but fitz-hugh curtis could be a remote possibilty ?- H. pylori breath test ?- Comprehensive metabolic panel ?- US Abdomen Limited RUQ (LIVER/GB); Future ? ?4. Nausea ?- H. pylori breath test ?- US Abdomen Limited RUQ (LIVER/GB); Future ? ?No acute red flags today ? ?Patient have been counseled extensively about nutrition and exercise. Other issues discussed during this visit include: low cholesterol diet, weight control and daily exercise, foot care, annual eye examinations at Ophthalmology, importance of adherence with medications and regular follow-up. We also discussed long term complications of uncontrolled diabetes and hypertension.  ? ?Return in about 6 months (around 08/21/2022), or if symptoms worsen or fail to improve. ? ?The patient was given clear instructions to go to ER or return to medical center if symptoms don't improve, worsen or new problems develop. The patient verbalized understanding. The patient was told to call to get lab results if they haven't heard anything in the next week.  ? ? ? ? ?Georgian Co, PA-C ?[facility] ?[place], [place] ?[phone]   ?02/19/2022, 10:39 AM  ?

## 2022-02-20 LAB — CERVICOVAGINAL ANCILLARY ONLY
Bacterial Vaginitis (gardnerella): NEGATIVE
Candida Glabrata: NEGATIVE
Candida Vaginitis: NEGATIVE
Chlamydia: NEGATIVE
Comment: NEGATIVE
Comment: NEGATIVE
Comment: NEGATIVE
Comment: NEGATIVE
Comment: NEGATIVE
Comment: NORMAL
Neisseria Gonorrhea: NEGATIVE
Trichomonas: NEGATIVE

## 2022-02-20 LAB — CBC WITH DIFFERENTIAL/PLATELET
Basophils Absolute: 0 10*3/uL (ref 0.0–0.2)
Basos: 1 %
EOS (ABSOLUTE): 0.2 10*3/uL (ref 0.0–0.4)
Eos: 4 %
Hematocrit: 42.6 % (ref 34.0–46.6)
Hemoglobin: 14.2 g/dL (ref 11.1–15.9)
Immature Grans (Abs): 0 10*3/uL (ref 0.0–0.1)
Immature Granulocytes: 0 %
Lymphocytes Absolute: 1.7 10*3/uL (ref 0.7–3.1)
Lymphs: 26 %
MCH: 28.7 pg (ref 26.6–33.0)
MCHC: 33.3 g/dL (ref 31.5–35.7)
MCV: 86 fL (ref 79–97)
Monocytes Absolute: 0.5 10*3/uL (ref 0.1–0.9)
Monocytes: 7 %
Neutrophils Absolute: 4.1 10*3/uL (ref 1.4–7.0)
Neutrophils: 62 %
Platelets: 246 10*3/uL (ref 150–450)
RBC: 4.94 x10E6/uL (ref 3.77–5.28)
RDW: 12.7 % (ref 11.7–15.4)
WBC: 6.5 10*3/uL (ref 3.4–10.8)

## 2022-02-20 LAB — COMPREHENSIVE METABOLIC PANEL
ALT: 20 IU/L (ref 0–32)
AST: 19 IU/L (ref 0–40)
Albumin/Globulin Ratio: 1.6 (ref 1.2–2.2)
Albumin: 4.6 g/dL (ref 3.9–5.0)
Alkaline Phosphatase: 81 IU/L (ref 44–121)
BUN/Creatinine Ratio: 11 (ref 9–23)
BUN: 8 mg/dL (ref 6–20)
Bilirubin Total: 1 mg/dL (ref 0.0–1.2)
CO2: 24 mmol/L (ref 20–29)
Calcium: 10 mg/dL (ref 8.7–10.2)
Chloride: 104 mmol/L (ref 96–106)
Creatinine, Ser: 0.74 mg/dL (ref 0.57–1.00)
Globulin, Total: 2.8 g/dL (ref 1.5–4.5)
Glucose: 83 mg/dL (ref 70–99)
Potassium: 4.2 mmol/L (ref 3.5–5.2)
Sodium: 141 mmol/L (ref 134–144)
Total Protein: 7.4 g/dL (ref 6.0–8.5)
eGFR: 116 mL/min/{1.73_m2} (ref >59)

## 2022-02-20 LAB — LIPASE: Lipase: 35 U/L (ref 14–72)

## 2022-02-21 LAB — H. PYLORI BREATH TEST: H pylori Breath Test: NEGATIVE

## 2022-02-23 ENCOUNTER — Ambulatory Visit
Admission: RE | Admit: 2022-02-23 | Discharge: 2022-02-23 | Disposition: A | Discharge status: Home / Self Care | Payer: Medicaid Other | Source: Ambulatory Visit | Attending: Physician Assistant | Admitting: Physician Assistant

## 2022-02-23 DIAGNOSIS — R1011 Right upper quadrant pain: Secondary | ICD-10-CM | POA: Diagnosis not present

## 2022-02-23 DIAGNOSIS — Z8619 Personal history of other infectious and parasitic diseases: Secondary | ICD-10-CM

## 2022-02-23 DIAGNOSIS — R110 Nausea: Secondary | ICD-10-CM

## 2022-02-24 ENCOUNTER — Telehealth: Payer: Self-pay | Admitting: Internal Medicine

## 2022-02-24 NOTE — Telephone Encounter (Signed)
Will forward to Angela

## 2022-02-24 NOTE — Telephone Encounter (Signed)
Pt contacted the office to go over the ultrasound results. Made pt aware that the provider will look at the results and make her comments. Pt states she understands and doesn't have any questions or concerns ? ?Lauren [name] pt states she is still not able to eat. Pt is requesting a mychart message to be sent to her in regards to the ultrasound results  ?

## 2022-02-24 NOTE — Telephone Encounter (Signed)
Copied from CRM [id number]. Topic: General - Other ?>> Feb 24, 2022  1:55 PM Laural Benes, [place] C wrote: ?Reason for CRM: pt called in for her lab results. Pt says that he viewed them on my-chart but said she has questions ? ?CB: [phone] - please assist further. ?

## 2022-02-25 ENCOUNTER — Other Ambulatory Visit: Payer: Self-pay | Admitting: Physician Assistant

## 2022-02-25 DIAGNOSIS — R140 Abdominal distension (gaseous): Secondary | ICD-10-CM

## 2022-02-25 DIAGNOSIS — R1011 Right upper quadrant pain: Secondary | ICD-10-CM

## 2022-03-18 ENCOUNTER — Encounter: Payer: Self-pay | Admitting: Gastroenterology

## 2022-03-18 ENCOUNTER — Ambulatory Visit: Payer: Medicaid Other | Admitting: Gastroenterology

## 2022-03-18 VITALS — BP 100/70 | HR 75 | Ht 64.0 in | Wt 171.0 lb

## 2022-03-18 DIAGNOSIS — M549 Dorsalgia, unspecified: Secondary | ICD-10-CM

## 2022-03-18 DIAGNOSIS — R1013 Epigastric pain: Secondary | ICD-10-CM | POA: Diagnosis not present

## 2022-03-18 MED ORDER — OMEPRAZOLE 40 MG PO CPDR: 40.0000 mg | DELAYED_RELEASE_CAPSULE | Freq: Every day | ORAL | 6 refills | Status: DC

## 2022-03-18 NOTE — Progress Notes (Signed)
HPI: ?This is a very pleasant 26 year old woman who was referred to me by Anders Simmonds, PA-C  to evaluate epigastric/right upper quadrant pains.   ? ?She has had postprandial, nearly constant epigastric pains for about 2 years.  She always feels some sort of discomfort and after she eats this seems to generally get worse.  She has been avoiding food because of this in the past several weeks because it is becoming more noticeable.  Sometimes this pain can radiate to her back.  It is a belt-like pressure sensation.  She has lost 6 or 8 pounds in the past few weeks because of this.  She gets early satiety. ? ?Ibuprofen is on her medicine list but she rarely takes this, maybe once every few weeks or so.  She does take Tylenol Extra Strength 2 or 3 times per week. ? ?Previously she was bothered by heartburn, she would take Tums or Pepto-Bismol for this and it would generally help.  She has been off of those recently. ? ? ?Old Data Reviewed: ?Lab work 02/2022 H. pylori breath test negative, urine pregnancy test negative, complete metabolic profile was normal, CBC was normal, lipase was normal, ? ?Right upper quadrant ultrasound 02/2022 indication "right upper quadrant pain" findings normal examination ? ? ? ?Review of systems: ?Pertinent positive and negative review of systems were noted in the above HPI section. All other review negative. ? ? ?Past Medical History:  ?Diagnosis Date  ? Dysrhythmia   ? Migraines   ? ? ?Past Surgical History:  ?Procedure Laterality Date  ? ADENOIDECTOMY W/ MYRINGOTOMY  2002  ? 26 Years old   ? TONSILLECTOMY  3769  ? 26 Years old   ? TYMPANOSTOMY TUBE PLACEMENT Bilateral 1999, 2000, 2416  ? 76 weeks old, 26 year old and 26 years old   ? WISDOM TOOTH EXTRACTION  4863  ? 26 Years old   ? ? ?Current Outpatient Medications  ?Medication Instructions  ? amoxicillin-clavulanate (AUGMENTIN) 875-125 MG tablet 1 tablet, Oral, Every 12 hours  ? dextromethorphan-guaiFENesin (MUCINEX DM) 30-600 MG 12hr  tablet 2 tablets, Oral, 2 times daily  ? fluconazole (DIFLUCAN) 150 mg, Oral, Weekly  ? ibuprofen (ADVIL) 800 mg, Oral, 3 times daily  ? loratadine-pseudoephedrine (CLARITIN-D 12-HOUR) 5-120 MG tablet Oral  ? misoprostol (CYTOTEC) 600 mcg, Oral,  Once, Place all 3 tablets in your cheek at one time and let them soften, then swallow them.  ? promethazine (PHENERGAN) 12.5-25 mg, Oral, Every 6 hours PRN  ? traMADol (ULTRAM) 50-100 mg, Oral, Every 6 hours PRN  ? ? ?Allergies as of 03/18/2022 - Review Complete 03/18/2022  ?Allergen Reaction Noted  ? Codeine Palpitations and Other (See Comments) 06/08/2017  ? Prednisone Other (See Comments) 05/23/2016  ? Septra [bactrim] Nausea And Vomiting 10/13/2011  ? Sulfa antibiotics Anaphylaxis and Nausea And Vomiting 09/25/2011  ? Other Itching, Swelling, and Rash 04/14/2016  ? ? ?Family History  ?Problem Relation Age of Onset  ? Migraines Mother   ? Menstrual problems Mother   ? Diabetes Mother   ? Anxiety disorder Mother   ?     xanax  ? Depression Mother   ?     prozac  ? Thyroid disease Sister   ? Diabetes Maternal Grandmother   ? Dementia Maternal Grandmother   ?     Died at 31  ? Diabetes Maternal Grandfather   ? Emphysema Maternal Grandfather   ?     Died at 85  ? Febrile seizures  Brother   ?     Died at the age of 29 years old  ? Pneumonia Paternal Grandmother   ?     Died at 61  ? Heart failure Paternal Grandfather   ?     Died at 49  ? ? ?Social History  ? ?Socioeconomic History  ? Marital status: Single  ?  Spouse name: Not on file  ? Number of children: 0  ? Years of education: Not on file  ? Highest education level: Not on file  ?Occupational History  ? Not on file  ?Tobacco Use  ? Smoking status: Former  ?  Types: Cigarettes  ?  Quit date: 2021  ?  Years since quitting: 2.3  ? Smokeless tobacco: Never  ? Tobacco comments:  ?  Mom smokes   ?Vaping Use  ? Vaping Use: Some days  ? Substances: Nicotine  ?Substance and Sexual Activity  ? Alcohol use: Not Currently  ?   Comment: occ  ? Drug use: No  ? Sexual activity: Yes  ?  Birth control/protection: None  ?Other Topics Concern  ? Not on file  ?Social History Narrative  ? Not on file  ? ?Social Determinants of Health  ? ?Financial Resource Strain: Not on file  ?Food Insecurity: Not on file  ?Transportation Needs: Not on file  ?Physical Activity: Not on file  ?Stress: Not on file  ?Social Connections: Not on file  ?Intimate Partner Violence: Not on file  ? ? ? ?Physical Exam: ?Ht 5\' 4"  (1.626 m)   Wt 171 lb (77.6 kg)   BMI 29.35 kg/m?  ?Constitutional: generally well-appearing ?Psychiatric: alert and oriented x3 ?Eyes: extraocular movements intact ?Mouth: oral pharynx moist, no lesions ?Neck: supple no lymphadenopathy ?Cardiovascular: heart regular rate and rhythm ?Lungs: clear to auscultation bilaterally ?Abdomen: soft, mildly tender right upper quadrant, nondistended, no obvious ascites, no peritoneal signs, normal bowel sounds ?Extremities: no lower extremity edema bilaterally ?Skin: no lesions on visible extremities ? ? ?Assessment and plan: ?26 y.o. female with epigastric/right upper quadrant chronic pains ? ?Unclear etiology.  Certainly this could be biliary.  Also possibly gastric.  Pains rating to the back sometimes gives me concerned about pancreatic process however certainly she would be quite young to have significant pancreatic pathology.  I recommended we start the work-up with CT scan abdomen pelvis.  She understands that if this is not helpful then the next step would be HIDA scan to check for gallbladder dyskinesia and then upper endoscopy.  She is going to start omeprazole 40 mg once daily for now.  She had quite a lot of very appropriate lab test last month and I do not think we need to be repeated for now. ? ?Please see the "Patient Instructions" section for addition details about the plan. ? ? ?27, MD ?[facility] ?03/18/2022, 10:17 AM ? ?Cc: 05/18/2022, PA-C ? ?Total time on date  of encounter was 45  minutes (this included time spent preparing to see the patient reviewing records; obtaining and/or reviewing separately obtained history; performing a medically appropriate exam and/or evaluation; counseling and educating the patient and family if present; ordering medications, tests or procedures if applicable; and documenting clinical information in the health record). ? ? ?

## 2022-03-18 NOTE — Patient Instructions (Signed)
If you are age 24 or younger, your body mass index should be between 19-25. Your Body mass index is 29.35 kg/m?Marland Kitchen If this is out of the aformentioned range listed, please consider follow up with your Primary Care Provider.  ?________________________________________________________ ? ?The [facility] providers would like to encourage you to use [facility] to communicate with providers for non-urgent requests or questions.  Due to long hold times on the telephone, sending your provider a message by [facility] may be a faster and more efficient way to get a response.  Please allow 48 business hours for a response.  Please remember that this is for non-urgent requests.  ?_______________________________________________________ ? ?We have sent the following medications to your pharmacy for you to pick up at your convenience: ? ?START: Omeprazole 30m one capsule daily shortly before breakfast meal each day. ? ?You have been scheduled for a CT [facility] of the abdomen and pelvis [facility] 1[facility] Radiology. You are scheduled on 03-25-22  at 8:30am. You should arrive 15 minutes prior to your appointment time for registration.  The solution may taste better if refrigerated, but do NOT add ice or any other liquid to this solution. Shake well before drinking.  ? ?Please follow the written instructions below on the day of your exam:  ? ?1) Do not eat anything after 4:30am (4 hours prior to your test)  ? ?2) Drink 1 bottle of contrast @ 6:30am (2 hours prior to your exam)  Remember to shake well before drinking and do NOT pour over ice. ?    Drink 1 bottle of contrast @ 7:30am (1 hour prior to your exam)  ? ?You may take any medications as prescribed with a small amount of water, if necessary. If you take any of the following medications: METFORMIN, GLUCOPHAGE, GLUCOVANCE, AVANDAMET, RIOMET, FORTAMET, ACTOPLUS MET, JANUMET, G[place]or METAGLIP, you MAY be asked to HOLD this medication 48 hours AFTER the exam.  ? ?The purpose  of you drinking the oral contrast is to aid in the visualization of your intestinal tract. The contrast solution may cause some diarrhea. Depending on your individual set of symptoms, you may also receive an intravenous injection of x-ray contrast/dye. [facility], depending on the type of exam you are having performed.  ? ?If you have any questions regarding your exam or if you need to reschedule, you may call [facility] at [phone] the hours of 8:00 am and 5:00 pm, Monday-Friday.  ? ?Due to recent changes in healthcare laws, you may see the results of your imaging and laboratory studies on MyChart before your provider has had a chance to review them.  We understand that in some cases there may be results that are confusing or concerning to you. Not all laboratory results come back in the same time frame and the provider may be waiting for multiple results in order to interpret others.  Please give [id number] hours in order for your provider to thoroughly review all the results before contacting the office for clarification of your results.  ? ?Thank you for entrusting me with your care and [facility] ? ?Dr JArdis Hughs? ? ? ?

## 2022-03-25 ENCOUNTER — Ambulatory Visit (HOSPITAL_COMMUNITY)
Admission: RE | Admit: 2022-03-25 | Discharge: 2022-03-25 | Disposition: A | Discharge status: Home / Self Care | Payer: Medicaid Other | Source: Ambulatory Visit | Attending: Gastroenterology | Admitting: Gastroenterology

## 2022-03-25 DIAGNOSIS — R1110 Vomiting, unspecified: Secondary | ICD-10-CM | POA: Diagnosis not present

## 2022-03-25 DIAGNOSIS — R109 Unspecified abdominal pain: Secondary | ICD-10-CM | POA: Diagnosis not present

## 2022-03-25 DIAGNOSIS — R1013 Epigastric pain: Secondary | ICD-10-CM | POA: Diagnosis not present

## 2022-03-25 DIAGNOSIS — M549 Dorsalgia, unspecified: Secondary | ICD-10-CM | POA: Insufficient documentation

## 2022-03-25 MED ORDER — SODIUM CHLORIDE (PF) 0.9 % IJ SOLN
INTRAMUSCULAR | Status: AC
  Filled 2022-03-25: qty 50

## 2022-03-25 MED ORDER — IOHEXOL 300 MG/ML  SOLN
100.0000 mL | Freq: Once | INTRAMUSCULAR | Status: AC | PRN
  Administered 2022-03-25: 100 mL via INTRAVENOUS

## 2022-04-08 ENCOUNTER — Other Ambulatory Visit: Payer: Self-pay

## 2022-04-08 ENCOUNTER — Encounter: Payer: Self-pay | Admitting: Gastroenterology

## 2022-04-08 DIAGNOSIS — R1013 Epigastric pain: Secondary | ICD-10-CM

## 2022-04-08 DIAGNOSIS — M549 Dorsalgia, unspecified: Secondary | ICD-10-CM

## 2022-04-27 ENCOUNTER — Inpatient Hospital Stay (HOSPITAL_COMMUNITY): Admission: RE | Admit: 2022-04-27 | Payer: Medicaid Other | Source: Ambulatory Visit

## 2022-05-07 ENCOUNTER — Encounter (HOSPITAL_COMMUNITY): Admission: RE | Admit: 2022-05-07 | Payer: Medicaid Other | Source: Ambulatory Visit

## 2022-06-01 DIAGNOSIS — F332 Major depressive disorder, recurrent severe without psychotic features: Secondary | ICD-10-CM | POA: Diagnosis not present

## 2022-06-01 DIAGNOSIS — F4010 Social phobia, unspecified: Secondary | ICD-10-CM | POA: Diagnosis not present

## 2022-07-24 ENCOUNTER — Telehealth: Payer: Medicaid Other | Admitting: Emergency Medicine

## 2022-07-24 ENCOUNTER — Ambulatory Visit: Payer: Self-pay

## 2022-07-24 DIAGNOSIS — J329 Chronic sinusitis, unspecified: Secondary | ICD-10-CM | POA: Diagnosis not present

## 2022-07-24 MED ORDER — AMOXICILLIN-POT CLAVULANATE 875-125 MG PO TABS: 1.0000 | ORAL_TABLET | Freq: Two times a day (BID) | ORAL | 0 refills | Status: DC

## 2022-07-24 MED ORDER — BENZONATATE 100 MG PO CAPS: 100.0000 mg | ORAL_CAPSULE | Freq: Two times a day (BID) | ORAL | 0 refills | Status: DC | PRN

## 2022-07-24 NOTE — Telephone Encounter (Signed)
Pt called saying she feels like she has a sinus infection.  There are no appts until oct for chw.   Please advise  [phone]     Chief Complaint: Sinus pain, pressure. Yellow-green mucus Symptoms: Cough Frequency: 2 weeks ago Pertinent Negatives: Patient denies fever Disposition: [] ED /[] Urgent Care (no appt availability in office) / [] Appointment(In office/virtual)/ [x]  [facility]/ [] Home Care/ [] Refused Recommended Disposition /[]  Mobile Bus/ []  Follow-up with PCP Additional Notes:   Reason for Disposition  [1] SEVERE pain AND [2] not improved 2 hours after pain medicine  Answer Assessment - Initial Assessment Questions 1. LOCATION: "Where does it hurt?"      Face 2. ONSET: "When did the sinus pain start?"  (e.g., hours, days)      2 weeks 3. SEVERITY: "How bad is the pain?"   (Scale 1-10; mild, moderate or severe)   - MILD (1-3): doesn't interfere with normal activities    - MODERATE (4-7): interferes with normal activities (e.g., work or school) or awakens from sleep   - SEVERE (8-10): excruciating pain and patient unable to do any normal activities        7-8 4. RECURRENT SYMPTOM: "Have you ever had sinus problems before?" If Yes, ask: "When was the last time?" and "What happened that time?"      Yes 5. NASAL CONGESTION: "Is the nose blocked?" If Yes, ask: "Can you open it or must you breathe through your mouth?"     At times 6. NASAL DISCHARGE: "Do you have discharge from your nose?" If so ask, "What color?"     Yellow-green 7. FEVER: "Do you have a fever?" If Yes, ask: "What is it, how was it measured, and when did it start?"      Not now 8. OTHER SYMPTOMS: "Do you have any other symptoms?" (e.g., sore throat, cough, earache, difficulty breathing)     Cough 9. PREGNANCY: "Is there any chance you are pregnant?" "When was your last menstrual period?"     No  Protocols used: Sinus Pain or Congestion-A-AH

## 2022-07-24 NOTE — Progress Notes (Signed)
Virtual Visit Consent   Lauren [name], you are scheduled for a virtual visit with a [facility] provider today. Just as with appointments in the office, your consent must be obtained to participate. Your consent will be active for this visit and any virtual visit you may have with one of our providers in the next 365 days. If you have a MyChart account, a copy of this consent can be sent to you electronically.  As this is a virtual visit, video technology does not allow for your provider to perform a traditional examination. This may limit your provider's ability to fully assess your condition. If your provider identifies any concerns that need to be evaluated in person or the need to arrange testing (such as labs, EKG, etc.), we will make arrangements to do so. Although advances in technology are sophisticated, we cannot ensure that it will always work on either your end or our end. If the connection with a video visit is poor, the visit may have to be switched to a telephone visit. With either a video or telephone visit, we are not always able to ensure that we have a secure connection.  By engaging in this virtual visit, you consent to the provision of healthcare and authorize for your insurance to be billed (if applicable) for the services provided during this visit. Depending on your insurance coverage, you may receive a charge related to this service.  I need to obtain your verbal consent now. Are you willing to proceed with your visit today? [name] has provided verbal consent on 07/24/2022 for a virtual visit (video or telephone). Roxy Horseman, PA-C  Date: 07/24/2022 12:07 PM  Virtual Visit via Video Note   I, Roxy Horseman, connected with  Lauren [name]  ([id number], [birth year]) on 07/24/22 at 12:00 PM EDT by a video-enabled telemedicine application and verified that I am speaking with the correct person using two identifiers.  Location: Patient: Virtual Visit Location  Patient: Home Provider: Virtual Visit Location Provider: Home   I discussed the limitations of evaluation and management by telemedicine and the availability of in person appointments. The patient expressed understanding and agreed to proceed.    History of Present Illness: Lauren [name] is a 26 y.o. who identifies as a female who was assigned female at birth, and is being seen today for sinus pressure and congestion.  States that she has had a cough for the past 3 weeks.  Last week she started to get severe sinus pressure.  Has been taking mucinex for the past couple of weeks without improvement.  States that she has green mucous.  Reports mild cough.  Has tested negative for COVID repeatedly during the course of this illness.  HPI: HPI  Problems:  Patient Active Problem List   Diagnosis Date Noted   Breast pain, right 01/06/2021   Palpitations 01/06/2021   Anxiety and depression 01/06/2021   Vapes nicotine containing substance 01/06/2021   Myopia of both eyes 01/06/2021   Tetanus, diphtheria, and acellular pertussis (Tdap) vaccination declined 01/06/2021   Major depressive disorder, recurrent episode, moderate (HCC) 01/06/2021   Generalized anxiety disorder 01/06/2021   Migraine without aura and without status migrainosus, not intractable 02/26/2015   Episodic tension-type headache, not intractable 02/26/2015   Poor sleep hygiene 02/26/2015   Presence of subdermal contraceptive implant 03/08/2014   Irregular menses 03/08/2014   Acne 03/08/2014   Chronic headaches 10/28/2013   Sleep disorder 09/21/2013   ADHD (attention deficit hyperactivity disorder),  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   Problems with learning 09/21/2013    Allergies:  Allergies  Allergen Reactions   Codeine Palpitations and Other (See Comments)    Speeds up heart rate   Prednisone Other (See Comments)    Extreme crying Back breaks out    Septra [Bactrim] Nausea And Vomiting   Sulfa Antibiotics Anaphylaxis and  Nausea And Vomiting   Other Itching, Swelling and Rash    TACO SEASONING (MARC[employer] BRAND FROM [employer])   Tape Dermatitis    Red and irritation    Medications:  Current Outpatient Medications:    amoxicillin-clavulanate (AUGMENTIN) 875-125 MG tablet, Take 1 tablet by mouth every 12 (twelve) hours., Disp: 14 tablet, Rfl: 0   benzonatate (TESSALON) 100 MG capsule, Take 1 capsule (100 mg total) by mouth 2 (two) times daily as needed for cough., Disp: 20 capsule, Rfl: 0   ibuprofen (ADVIL) 800 MG tablet, Take 1 tablet (800 mg total) by mouth 3 (three) times daily. (Patient not taking: Reported on 01/14/2022), Disp: 21 tablet, Rfl: 0   loratadine-pseudoephedrine (CLARITIN-D 12-HOUR) 5-120 MG tablet, Take by mouth., Disp: , Rfl:    omeprazole (PRILOSEC) 40 MG capsule, Take 1 capsule (40 mg total) by mouth daily. Take shortly before breakfast meal each day., Disp: 30 capsule, Rfl: 6  Observations/Objective: Patient is well-developed, well-nourished in no acute distress.  Resting comfortably  at home.  Head is normocephalic, atraumatic.  No labored breathing.  Speech is clear and coherent with logical content.  Patient is alert and oriented at baseline.    Assessment and Plan: 1. Sinusitis, unspecified chronicity, unspecified location   Augmentin Tessalon  Follow Up Instructions: I discussed the assessment and treatment plan with the patient. The patient was provided an opportunity to ask questions and all were answered. The patient agreed with the plan and demonstrated an understanding of the instructions.  A copy of instructions were sent to the patient via MyChart unless otherwise noted below.     The patient was advised to call back or seek an in-person evaluation if the symptoms worsen or if the condition fails to improve as anticipated.  Time:  I spent 12 minutes with the patient via telehealth technology discussing the above problems/concerns.    Roxy Horseman, PA-C

## 2022-08-03 ENCOUNTER — Encounter: Payer: Self-pay | Admitting: Physician Assistant

## 2022-08-03 ENCOUNTER — Other Ambulatory Visit: Payer: Self-pay | Admitting: Internal Medicine

## 2022-08-03 ENCOUNTER — Encounter: Payer: Self-pay | Admitting: Internal Medicine

## 2022-08-03 MED ORDER — FLUCONAZOLE 150 MG PO TABS: 150.0000 mg | ORAL_TABLET | Freq: Every day | ORAL | 0 refills | Status: DC

## 2022-08-03 NOTE — Telephone Encounter (Signed)
Patient called back checking to make sure the provider saw her message on my chart regarding needing a prescription for an antibiotic to treat a yeast infection. The patient uses  [facility] [place], [place] Phone:  [phone]  Fax:  [phone]     Please assist patient further

## 2022-11-05 ENCOUNTER — Encounter (HOSPITAL_COMMUNITY): Payer: Self-pay | Admitting: Emergency Medicine

## 2022-11-05 ENCOUNTER — Other Ambulatory Visit: Payer: Self-pay

## 2022-11-05 ENCOUNTER — Ambulatory Visit (HOSPITAL_COMMUNITY)
Admission: EM | Admit: 2022-11-05 | Discharge: 2022-11-05 | Disposition: A | Discharge status: Home / Self Care | Payer: Medicaid Other

## 2022-11-05 DIAGNOSIS — J988 Other specified respiratory disorders: Secondary | ICD-10-CM

## 2022-11-05 DIAGNOSIS — R051 Acute cough: Secondary | ICD-10-CM

## 2022-11-05 DIAGNOSIS — B9789 Other viral agents as the cause of diseases classified elsewhere: Secondary | ICD-10-CM

## 2022-11-05 MED ORDER — BENZONATATE 100 MG PO CAPS: 100.0000 mg | ORAL_CAPSULE | Freq: Three times a day (TID) | ORAL | 0 refills | Status: DC

## 2022-11-05 MED ORDER — PROMETHAZINE-DM 6.25-15 MG/5ML PO SYRP: 5.0000 mL | ORAL_SOLUTION | Freq: Every evening | ORAL | 0 refills | Status: DC | PRN

## 2022-11-05 NOTE — ED Triage Notes (Signed)
5 days ago started having uri symptoms.  Patient has been taking mucinex and tylenol, thought to be getting better until last night.  Patient woke with a fever 100.3 fever this morning.  Has had tylenol this morning

## 2022-11-05 NOTE — ED Provider Notes (Signed)
[facility]    CSN: [id number] Arrival date & time: 11/05/22  1142      History   Chief Complaint Chief Complaint  Patient presents with   Fever    HPI Lauren [name] Bloodsaw is a 26 y.o. female.   Patient presents urgent care with 5-day history of cough, fever/chills, sore throat, nasal congestion, and generalized fatigue/body aches.  No known sick contacts with similar symptoms.  She is not vaccinated against influenza or COVID-19.  Denies chest pain, shortness of breath, wheezing, weakness, nausea, vomiting, abdominal pain, diarrhea, dizziness, and headache.  She has been using Mucinex for symptoms without very much relief of nasal congestion.  Cough is mostly dry and worse at nighttime.  Steam helps with nasal congestion intermittently.  No recent antibiotic or steroid use.  She is not a smoker and denies drug use.  Denies history of chronic respiratory problems.  States fever was 100.3 this morning but responded well to Tylenol over-the-counter.   Fever   Past Medical History:  Diagnosis Date   Dysrhythmia    Migraines     Patient Active Problem List   Diagnosis Date Noted   Breast pain, right 01/06/2021   Palpitations 01/06/2021   Anxiety and depression 01/06/2021   Vapes nicotine containing substance 01/06/2021   Myopia of both eyes 01/06/2021   Tetanus, diphtheria, and acellular pertussis (Tdap) vaccination declined 01/06/2021   Major depressive disorder, recurrent episode, moderate (HCC) 01/06/2021   Generalized anxiety disorder 01/06/2021   Migraine without aura and without status migrainosus, not intractable 02/26/2015   Episodic tension-type headache, not intractable 02/26/2015   Poor sleep hygiene 02/26/2015   Presence of subdermal contraceptive implant 03/08/2014   Irregular menses 03/08/2014   Acne 03/08/2014   Chronic headaches 10/28/2013   Sleep disorder 09/21/2013   ADHD (attention deficit hyperactivity disorder), 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    Problems with learning 09/21/2013    Past Surgical History:  Procedure Laterality Date   ADENOIDECTOMY W/ MYRINGOTOMY  [id number]   26 Years old    TONSILLECTOMY  [id number]   [age] Years old    TYMPANOSTOMY TUBE PLACEMENT Bilateral 1999, 200510, [id number]   328 weeks old, 26 year old and 26 years old    WISDOM TOOTH EXTRACTION  [id number]   26 Years old     OB History     Gravida  2   Para  0   Term  0   Preterm  0   AB  1   Living  0      SAB  1   IAB  0   Ectopic  0   Multiple  0   Live Births  0            Home Medications    Prior to Admission medications   Medication Sig Start Date End Date Taking? Authorizing Provider  benzonatate (TESSALON) 100 MG capsule Take 1 capsule (100 mg total) by mouth every 8 (eight) hours. 11/05/22  Yes Carlisle BeersStanhope, Taleen Prosser M, FNP  cetirizine (ZYRTEC) 10 MG tablet Take 10 mg by mouth daily.   Yes [provider]  promethazine-dextromethorphan (PROMETHAZINE-DM) 6.25-15 MG/5ML syrup Take 5 mLs by mouth at bedtime as needed for cough. 11/05/22  Yes Carlisle BeersStanhope, Zoua Caporaso M, FNP  amoxicillin-clavulanate (AUGMENTIN) 875-125 MG tablet Take 1 tablet by mouth every 12 (twelve) hours. Patient not taking: Reported on 11/05/2022 07/24/22   Roxy HorsemanBrowning, Robert, PA-C  fluconazole (DIFLUCAN) 150 MG tablet Take 1 tablet (150 mg total) by mouth  daily. Patient not taking: Reported on 11/05/2022 08/03/22   Marcine Matar, MD  ibuprofen (ADVIL) 800 MG tablet Take 1 tablet (800 mg total) by mouth 3 (three) times daily. Patient not taking: Reported on 01/14/2022 10/09/21   Valinda Hoar, NP  loratadine-pseudoephedrine (CLARITIN-D 12-HOUR) 5-120 MG tablet Take by mouth. Patient not taking: Reported on 11/05/2022 02/03/20   [provider]  omeprazole (PRILOSEC) 40 MG capsule Take 1 capsule (40 mg total) by mouth daily. Take shortly before breakfast meal each day. Patient not taking: Reported on 11/05/2022 03/18/22   Rachael Fee, MD    Family  History Family History  Problem Relation Age of Onset   Migraines Mother    Menstrual problems Mother    Diabetes Mother    Anxiety disorder Mother        xanax   Depression Mother        prozac   Thyroid disease Sister    Diabetes Maternal Grandmother    Dementia Maternal Grandmother        Died at 64   Diabetes Maternal Grandfather    Emphysema Maternal Grandfather        Died at 52   Febrile seizures Brother        Died at the age of 85 years old   Pneumonia Paternal Grandmother        Died at 66   Heart failure Paternal Grandfather        Died at 97    Social History Social History   Tobacco Use   Smoking status: Former    Types: Cigarettes    Quit date: 2021    Years since quitting: 2.9   Smokeless tobacco: Never   Tobacco comments:    Mom smokes   Vaping Use   Vaping Use: Former   Substances: Nicotine  Substance Use Topics   Alcohol use: Not Currently    Comment: occ   Drug use: No     Allergies   Codeine, Prednisone, Septra [bactrim], Sulfa antibiotics, Other, and Tape   Review of Systems Review of Systems  Constitutional:  Positive for fever.  Per HPI   Physical Exam Triage Vital Signs ED Triage Vitals  Enc Vitals Group     BP 11/05/22 1416 121/76     Pulse Rate 11/05/22 1416 84     Resp 11/05/22 1416 18     Temp 11/05/22 1416 98 F (36.7 C)     Temp Source 11/05/22 1416 Oral     SpO2 11/05/22 1416 96 %     Weight --      Height --      Head Circumference --      Peak Flow --      Pain Score 11/05/22 1412 8     Pain Loc --      Pain Edu? --      Excl. in GC? --    No data found.  Updated Vital Signs BP 121/76 (BP Location: Left Arm)   Pulse 84   Temp 98 F (36.7 C) (Oral)   Resp 18   LMP 10/24/2022   SpO2 96%   Visual Acuity Right Eye Distance:   Left Eye Distance:   Bilateral Distance:    Right Eye Near:   Left Eye Near:    Bilateral Near:     Physical Exam Vitals and nursing note reviewed.  Constitutional:       Appearance: She is not ill-appearing or toxic-appearing.  HENT:     Head: Normocephalic and atraumatic.     Right Ear: Hearing, tympanic membrane, ear canal and external ear normal.     Left Ear: Hearing, tympanic membrane, ear canal and external ear normal.     Nose: Congestion present.     Mouth/Throat:     Lips: Pink.     Mouth: Mucous membranes are moist.     Pharynx: Posterior oropharyngeal erythema present.     Comments: Small amount of clear postnasal drainage visualized to the posterior oropharynx.  Eyes:     General: Lids are normal. Vision grossly intact. Gaze aligned appropriately.     Extraocular Movements: Extraocular movements intact.     Conjunctiva/sclera: Conjunctivae normal.  Cardiovascular:     Rate and Rhythm: Normal rate and regular rhythm.     Heart sounds: Normal heart sounds, S1 normal and S2 normal.  Pulmonary:     Effort: Pulmonary effort is normal. No respiratory distress.     Breath sounds: Normal breath sounds and air entry.  Abdominal:     General: Bowel sounds are normal.  Musculoskeletal:     Cervical back: Neck supple.  Lymphadenopathy:     Cervical: Cervical adenopathy present.  Skin:    General: Skin is warm and dry.     Capillary Refill: Capillary refill takes less than 2 seconds.     Findings: No rash.  Neurological:     General: No focal deficit present.     Mental Status: She is alert and oriented to person, place, and time. Mental status is at baseline.     Cranial Nerves: No dysarthria or facial asymmetry.  Psychiatric:        Mood and Affect: Mood normal.        Speech: Speech normal.        Behavior: Behavior normal.        Thought Content: Thought content normal.        Judgment: Judgment normal.      UC Treatments / Results  Labs (all labs ordered are listed, but only abnormal results are displayed) Labs Reviewed - No data to display  EKG   Radiology No results found.  Procedures Procedures (including critical care  time)  Medications Ordered in UC Medications - No data to display  Initial Impression / Assessment and Plan / UC Course  I have reviewed the triage vital signs and the nursing notes.  Pertinent labs & imaging results that were available during my care of the patient were reviewed by me and considered in my medical decision making (see chart for details).   Viral URI with cough Symptoms and physical exam consistent with a viral upper respiratory tract infection that will likely resolve with rest, fluids, and prescriptions for symptomatic relief. No indication for imaging today based on stable cardiopulmonary exam and hemodynamically stable vital signs.  Deferred viral testing due to timing of illness.  Tessalon Perles and Promethazine DM sent to pharmacy for symptomatic relief to be taken as prescribed. Promethazine DM cough medication may be used as needed only at bedtime due to possible drowsiness side effect (no alcohol, working, or driving while taking this advised).  May continue using guaifenesin at home to help with nasal congestion and cough. May use ibuprofen/tylenol over the counter for body aches, fever/chills, and overall discomfort associated with viral illness. Nonpharmacologic interventions for symptom relief provided and after visit summary below.   Strict ED/urgent care return precautions given.  Patient verbalizes understanding and agreement  with plan.  Counseled patient regarding possible side effects and uses of all medications prescribed at today's visit.  Patient verbalizes understanding and agreement with plan.  All questions answered.  Patient discharged from urgent care in stable condition.        Final Clinical Impressions(s) / UC Diagnoses   Final diagnoses:  Viral respiratory illness  Acute cough     Discharge Instructions      You have a viral upper respiratory infection.  Continue taking Mucinex.  Drink plenty of water when taking this medicine so they can  work best in your body.  Take Promethazine DM cough medication to help with your cough at nighttime so that you are able to sleep. Do not drive, drink alcohol, or go to work while taking this medication since it can make you sleepy. Only take this at nighttime.    Take tessalon pearles every 8 hours as needed for cough.  You may take tylenol 1,000mg  and ibuprofen 600mg  every 6 hours with food as needed for fever/chills, sore throat, aches/pains, and inflammation associated with viral illness. Take this with food to avoid stomach upset.    You may do salt water and baking soda gargles every 4 hours as needed for your throat pain.  Please put 1 teaspoon of salt and 1/2 teaspoon of baking soda in 8 ounces of warm water then gargle and spit the water out. You may also put 1 tablespoon of honey in warm water and drink this to soothe your throat.  Place a humidifier in your room at night to help decrease dry air that can irritate your airway and cause you to have a sore throat and cough.  Please try to eat a well-balanced diet while you are sick so that your body gets proper nutrition to heal.  If you develop any new or worsening symptoms, please return.  If your symptoms are severe, please go to the emergency room.  Follow-up with your primary care provider for further evaluation and management of your symptoms as well as ongoing wellness visits.  I hope you feel better!       ED Prescriptions     Medication Sig Dispense Auth. Provider   promethazine-dextromethorphan (PROMETHAZINE-DM) 6.25-15 MG/5ML syrup Take 5 mLs by mouth at bedtime as needed for cough. 118 mL 01-[birth year]-1991 M, FNP   benzonatate (TESSALON) 100 MG capsule Take 1 capsule (100 mg total) by mouth every 8 (eight) hours. 21 capsule M, FNP      PDMP not reviewed this encounter.   Carlisle Beers, Carlisle Beers 11/05/22 1435

## 2022-11-05 NOTE — Discharge Instructions (Signed)
You have a viral upper respiratory infection.  Continue taking Mucinex.  Drink plenty of water when taking this medicine so they can work best in your body.  Take Promethazine DM cough medication to help with your cough at nighttime so that you are able to sleep. Do not drive, drink alcohol, or go to work while taking this medication since it can make you sleepy. Only take this at nighttime.    Take tessalon pearles every 8 hours as needed for cough.  You may take tylenol 1,000mg  and ibuprofen 600mg  every 6 hours with food as needed for fever/chills, sore throat, aches/pains, and inflammation associated with viral illness. Take this with food to avoid stomach upset.    You may do salt water and baking soda gargles every 4 hours as needed for your throat pain.  Please put 1 teaspoon of salt and 1/2 teaspoon of baking soda in 8 ounces of warm water then gargle and spit the water out. You may also put 1 tablespoon of honey in warm water and drink this to soothe your throat.  Place a humidifier in your room at night to help decrease dry air that can irritate your airway and cause you to have a sore throat and cough.  Please try to eat a well-balanced diet while you are sick so that your body gets proper nutrition to heal.  If you develop any new or worsening symptoms, please return.  If your symptoms are severe, please go to the emergency room.  Follow-up with your primary care provider for further evaluation and management of your symptoms as well as ongoing wellness visits.  I hope you feel better!

## 2023-09-03 LAB — OB RESULTS CONSOLE HIV ANTIBODY (ROUTINE TESTING): HIV: NONREACTIVE

## 2023-09-03 LAB — OB RESULTS CONSOLE GC/CHLAMYDIA
Chlamydia: NEGATIVE
Neisseria Gonorrhea: NEGATIVE

## 2023-09-03 LAB — OB RESULTS CONSOLE RPR: RPR: NONREACTIVE

## 2023-09-03 LAB — OB RESULTS CONSOLE HEPATITIS B SURFACE ANTIGEN: Hepatitis B Surface Ag: NEGATIVE

## 2023-09-03 LAB — OB RESULTS CONSOLE RUBELLA ANTIBODY, IGM: Rubella: IMMUNE

## 2023-09-13 ENCOUNTER — Ambulatory Visit: Payer: Self-pay

## 2023-09-13 ENCOUNTER — Encounter (HOSPITAL_COMMUNITY): Payer: Self-pay | Admitting: *Deleted

## 2023-09-13 ENCOUNTER — Inpatient Hospital Stay (HOSPITAL_COMMUNITY)
Admission: AD | Admit: 2023-09-13 | Discharge: 2023-09-13 | Disposition: A | Discharge status: Home / Self Care | Payer: Medicaid Other | Attending: Obstetrics & Gynecology | Admitting: Obstetrics & Gynecology

## 2023-09-13 DIAGNOSIS — O219 Vomiting of pregnancy, unspecified: Secondary | ICD-10-CM

## 2023-09-13 DIAGNOSIS — Z3A08 8 weeks gestation of pregnancy: Secondary | ICD-10-CM

## 2023-09-13 DIAGNOSIS — R1013 Epigastric pain: Secondary | ICD-10-CM | POA: Insufficient documentation

## 2023-09-13 DIAGNOSIS — O26891 Other specified pregnancy related conditions, first trimester: Secondary | ICD-10-CM | POA: Insufficient documentation

## 2023-09-13 LAB — URINALYSIS, ROUTINE W REFLEX MICROSCOPIC
Bacteria, UA: NONE SEEN
Bilirubin Urine: NEGATIVE
Glucose, UA: NEGATIVE mg/dL
Ketones, ur: 5 mg/dL — AB
Leukocytes,Ua: NEGATIVE
Nitrite: NEGATIVE
Protein, ur: NEGATIVE mg/dL
Specific Gravity, Urine: 1.015 (ref 1.005–1.030)
pH: 7 (ref 5.0–8.0)

## 2023-09-13 MED ORDER — FAMOTIDINE 20 MG PO TABS: 20.0000 mg | ORAL_TABLET | Freq: Two times a day (BID) | ORAL | 2 refills | Status: DC

## 2023-09-13 MED ORDER — SCOPOLAMINE 1 MG/3DAYS TD PT72
1.0000 | MEDICATED_PATCH | Freq: Once | TRANSDERMAL | Status: DC
  Administered 2023-09-13: 1.5 mg via TRANSDERMAL
  Filled 2023-09-13: qty 1

## 2023-09-13 MED ORDER — ONDANSETRON 4 MG PO TBDP: 4.0000 mg | ORAL_TABLET | Freq: Three times a day (TID) | ORAL | 0 refills | Status: DC | PRN

## 2023-09-13 MED ORDER — PANTOPRAZOLE SODIUM 20 MG PO TBEC: 20.0000 mg | DELAYED_RELEASE_TABLET | Freq: Every day | ORAL | 2 refills | Status: DC

## 2023-09-13 MED ORDER — FAMOTIDINE 20 MG PO TABS
10.0000 mg | ORAL_TABLET | Freq: Once | ORAL | Status: AC
  Administered 2023-09-13: 10 mg via ORAL
  Filled 2023-09-13: qty 1

## 2023-09-13 MED ORDER — ONDANSETRON 4 MG PO TBDP
4.0000 mg | ORAL_TABLET | Freq: Once | ORAL | Status: AC
  Administered 2023-09-13: 4 mg via ORAL
  Filled 2023-09-13: qty 1

## 2023-09-13 NOTE — MAU Provider Note (Signed)
History     CSN: [id number]  Arrival date and time: 09/13/23 7253   Event Date/Time   First Provider Initiated Contact with Patient 09/13/23 1106      Chief Complaint  Patient presents with   Emesis   Lauren [name] , a  27 y.o. G3P0010 at [redacted]w[redacted]d presents to MAU with complaints of nausea and vomiting for the last 3 days. Reports that she was taking diclegis and TUMS  and was getting relief until 3 days ago. She reports last dose was this morning at 7am, and reports 30 mins later she threw it back up. She reports unable to keep water down and last meal was yesterday. She also reports epigastric pain that she describes as "achy" and is unable to wear a bra from discomfort. She denies attempting to relieve this pain. She denies lower abdominal pain and vaginal discharge. She also denies urinary complaints.        Emesis  Associated symptoms include abdominal pain. Pertinent negatives include no chest pain, chills, diarrhea, fever or headaches.    OB History     Gravida  3   Para  0   Term  0   Preterm  0   AB  1   Living  0      SAB  1   IAB  0   Ectopic  0   Multiple  0   Live Births  0           Past Medical History:  Diagnosis Date   Dysrhythmia    Migraines     Past Surgical History:  Procedure Laterality Date   ADENOIDECTOMY W/ MYRINGOTOMY  7566   27 Years old    TONSILLECTOMY  59108   27 Years old    TYMPANOSTOMY TUBE PLACEMENT Bilateral 1999, 200103, 7521   70 weeks old, 27 year old and 27 years old    WISDOM TOOTH EXTRACTION  1365   27 Years old     Family History  Problem Relation Age of Onset   Migraines Mother    Menstrual problems Mother    Diabetes Mother    Anxiety disorder Mother        xanax   Depression Mother        prozac   Thyroid disease Sister    Diabetes Maternal Grandmother    Dementia Maternal Grandmother        Died at 24   Diabetes Maternal Grandfather    Emphysema Maternal Grandfather        Died at 48    Febrile seizures Brother        Died at the age of 65 years old   Pneumonia Paternal Grandmother        Died at 72   Heart failure Paternal Grandfather        Died at 53    Social History   Tobacco Use   Smoking status: Former    Current packs/day: 0.00    Types: Cigarettes    Quit date: 2021    Years since quitting: 3.8   Smokeless tobacco: Never   Tobacco comments:    Mom smokes   Vaping Use   Vaping status: Former   Substances: Nicotine  Substance Use Topics   Alcohol use: Not Currently    Comment: occ   Drug use: No    Allergies:  Allergies  Allergen Reactions   Codeine Palpitations and Other (See Comments)    Speeds up heart  rate   Prednisone Other (See Comments)    Extreme crying Back breaks out    Septra [Bactrim] Nausea And Vomiting   Sulfa Antibiotics Anaphylaxis and Nausea And Vomiting   Other Itching, Swelling and Rash    TACO SEASONING (MARCUM BRAND FROM [employer])   Tape Dermatitis    Red and irritation     Medications Prior to Admission  Medication Sig Dispense Refill Last Dose   amoxicillin-clavulanate (AUGMENTIN) 875-125 MG tablet Take 1 tablet by mouth every 12 (twelve) hours. (Patient not taking: Reported on 11/05/2022) 14 tablet 0    benzonatate (TESSALON) 100 MG capsule Take 1 capsule (100 mg total) by mouth every 8 (eight) hours. 21 capsule 0    cetirizine (ZYRTEC) 10 MG tablet Take 10 mg by mouth daily.      fluconazole (DIFLUCAN) 150 MG tablet Take 1 tablet (150 mg total) by mouth daily. (Patient not taking: Reported on 11/05/2022) 1 tablet 0    ibuprofen (ADVIL) 800 MG tablet Take 1 tablet (800 mg total) by mouth 3 (three) times daily. (Patient not taking: Reported on 01/14/2022) 21 tablet 0    loratadine-pseudoephedrine (CLARITIN-D 12-HOUR) 5-120 MG tablet Take by mouth. (Patient not taking: Reported on 11/05/2022)      omeprazole (PRILOSEC) 40 MG capsule Take 1 capsule (40 mg total) by mouth daily. Take shortly before breakfast meal each  day. (Patient not taking: Reported on 11/05/2022) 30 capsule 6    promethazine-dextromethorphan (PROMETHAZINE-DM) 6.25-15 MG/5ML syrup Take 5 mLs by mouth at bedtime as needed for cough. 118 mL 0     Review of Systems  Constitutional:  Negative for chills, fatigue and fever.  Eyes:  Negative for pain and visual disturbance.  Respiratory:  Negative for apnea, shortness of breath and wheezing.   Cardiovascular:  Negative for chest pain and palpitations.  Gastrointestinal:  Positive for abdominal pain, nausea and vomiting. Negative for constipation and diarrhea.  Genitourinary:  Negative for difficulty urinating, dysuria, pelvic pain, vaginal bleeding, vaginal discharge and vaginal pain.  Musculoskeletal:  Negative for back pain.  Neurological:  Negative for seizures, weakness and headaches.  Psychiatric/Behavioral:  Negative for suicidal ideas.    Physical Exam   Blood pressure 127/71, pulse 81, temperature 98.3 F (36.8 C), resp. rate 18, height 5\' 4"  (1.626 m), weight 69.4 kg, last menstrual period 10/24/2022, unknown if currently breastfeeding.  Physical Exam Vitals and nursing note reviewed.  Constitutional:      General: She is not in acute distress.    Appearance: Normal appearance. She is ill-appearing.  HENT:     Head: Normocephalic.  Pulmonary:     Effort: Pulmonary effort is normal.  Musculoskeletal:     Cervical back: Normal range of motion.  Skin:    General: Skin is warm and dry.  Neurological:     Mental Status: She is alert and oriented to person, place, and time.  Psychiatric:        Mood and Affect: Mood normal.     MAU Course  Procedures  Due to acuity. Meds given from Lobby and patient observed.   Meds ordered this encounter  Medications   ondansetron (ZOFRAN-ODT) disintegrating tablet 4 mg   famotidine (PEPCID) tablet 10 mg   scopolamine (TRANSDERM-SCOP) 1 MG/3DAYS 1.5 mg   Results for orders placed or performed during the hospital encounter of  09/13/23 (from the past 24 hour(s))  Urinalysis, Routine w reflex microscopic -Urine, Clean Catch     Status: Abnormal   Collection Time: 09/13/23  10:36 AM  Result Value Ref Range   Color, Urine YELLOW YELLOW   APPearance HAZY (A) CLEAR   Specific Gravity, Urine 1.015 1.005 - 1.030   pH 7.0 5.0 - 8.0   Glucose, UA NEGATIVE NEGATIVE mg/dL   Hgb urine dipstick SMALL (A) NEGATIVE   Bilirubin Urine NEGATIVE NEGATIVE   Ketones, ur 5 (A) NEGATIVE mg/dL   Protein, ur NEGATIVE NEGATIVE mg/dL   Nitrite NEGATIVE NEGATIVE   Leukocytes,Ua NEGATIVE NEGATIVE   RBC / HPF 0-5 0 - 5 RBC/hpf   WBC, UA 0-5 0 - 5 WBC/hpf   Bacteria, UA NONE SEEN NONE SEEN   Squamous Epithelial / HPF 11-20 0 - 5 /HPF   Mucus PRESENT     MDM - UA 5 of ketones and hazy otherwise normal  - Antiemetic ordered  - Abdominal pain improved from 8/10 to 6/10 with PO Pepcid.  - PO challenge successful  - Plan for discharge.   Assessment and Plan   1. Nausea and vomiting during pregnancy   2. [redacted] weeks gestation of pregnancy    - Reviewed nausea and vomiting as a normal discomfort of pregnancy  - Discussed medication options and non-medicinal options for pregnancy like sea bands and ginger chews.  - Reviewed worsening signs and return precautions.  - Patient discharged home in stable condition and may return to MAU as needed.  - Rx for Zofran and Pepcid sent to outpatient pharmacy   Claudette Head, MSN CNM  09/13/2023, 11:06 AM

## 2023-09-13 NOTE — Telephone Encounter (Signed)
  Chief Complaint: Upper abdominal pain - vomiting, pregnancy Symptoms: above Frequency: Has had upper abdominal pain for years, much worse the past 3-4 days Pertinent Negatives: Patient denies fever Disposition: [x] ED /[] Urgent Care (no appt availability in office) / [] Appointment(In office/virtual)/ []  [facility]/ [] Home Care/ [] Refused Recommended Disposition /[] [facility]/ []  Follow-up with PCP Additional Notes: Pt was discharged from ED this afternoon, but continues with upper abdominal pain and vomiting. Pt is [redacted] weeks pregnant. Pt mentions gall bladder issues and pancreatitis issue in her family. PT also mentioned hyperemesis Gravidarum. Pt states that pain is constant and sever. Pt will return to ED for care.    Reason for Disposition  Patient sounds very sick or weak to the triager  Answer Assessment - Initial Assessment Questions 1. LOCATION: "Where does it hurt?"      Abdominal - upper 2. RADIATION: "Does the pain shoot anywhere else?" (e.g., chest, back)     no 3. ONSET: "When did the pain begin?" (e.g., minutes, hours or days ago)      On and off for years - last 3-4 days unable to keep anything down 4. SUDDEN: "Gradual or sudden onset?"     Now sudden 5. PATTERN "Does the pain come and go, or is it constant?"    - If it comes and goes: "How long does it last?" "Do you have pain now?"     (Note: Comes and goes means the pain is intermittent. It goes away completely between bouts.)    - If constant: "Is it getting better, staying the same, or getting worse?"      (Note: Constant means the pain never goes away completely; most serious pain is constant and gets worse.)      Was coming and going and now constant 6. SEVERITY: "How bad is the pain?"  (e.g., Scale 1-10; mild, moderate, or severe)    - MILD (1-3): Doesn't interfere with normal activities, abdomen soft and not tender to touch.     - MODERATE (4-7): Interferes with normal activities or awakens  from sleep, abdomen tender to touch.     - SEVERE (8-10): Excruciating pain, doubled over, unable to do any normal activities.       severe 7. RECURRENT SYMPTOM: "Have you ever had this type of stomach pain before?" If Yes, ask: "When was the last time?" and "What happened that time?"      yes 8. CAUSE: "What do you think is causing the stomach pain?"     Gall bladder 9. RELIEVING/AGGRAVATING FACTORS: "What makes it better or worse?" (e.g., antacids, bending or twisting motion, bowel movement)     Moving hurts more 10. OTHER SYMPTOMS: "Do you have any other symptoms?" (e.g., back pain, diarrhea, fever, urination pain, vomiting)       vomiting 11. PREGNANCY: "Is there any chance you are pregnant?" "When was your last menstrual period?"       8 weeks  Protocols used: Abdominal Pain - [facility]

## 2023-09-13 NOTE — MAU Note (Signed)
.  Lauren [name] is a 27 y.o. at [redacted]w[redacted]d here in MAU reporting: increase in n/v x 3 days. Has been taking dicleges had been helping up until 3 days ago. Also c/o painful epigastric pain. LMP:  Onset of complaint: 3 days Pain score: 8 Vitals:   09/13/23 1015  BP: 127/71  Pulse: 81  Resp: 18  Temp: 98.3 F (36.8 C)     FHT:n/a Lab orders placed from triage:  u/a

## 2023-09-13 NOTE — Telephone Encounter (Signed)
noted 

## 2023-09-15 ENCOUNTER — Inpatient Hospital Stay (HOSPITAL_COMMUNITY)
Admission: AD | Admit: 2023-09-15 | Discharge: 2023-09-15 | Disposition: A | Discharge status: Home / Self Care | Payer: Medicaid Other | Attending: Obstetrics and Gynecology | Admitting: Obstetrics and Gynecology

## 2023-09-15 ENCOUNTER — Inpatient Hospital Stay (HOSPITAL_COMMUNITY): Payer: Medicaid Other

## 2023-09-15 ENCOUNTER — Encounter (HOSPITAL_COMMUNITY): Payer: Self-pay | Admitting: Obstetrics and Gynecology

## 2023-09-15 DIAGNOSIS — Z3A08 8 weeks gestation of pregnancy: Secondary | ICD-10-CM | POA: Insufficient documentation

## 2023-09-15 DIAGNOSIS — R1011 Right upper quadrant pain: Secondary | ICD-10-CM | POA: Diagnosis present

## 2023-09-15 DIAGNOSIS — O219 Vomiting of pregnancy, unspecified: Secondary | ICD-10-CM | POA: Insufficient documentation

## 2023-09-15 DIAGNOSIS — R112 Nausea with vomiting, unspecified: Secondary | ICD-10-CM | POA: Diagnosis present

## 2023-09-15 DIAGNOSIS — O99281 Endocrine, nutritional and metabolic diseases complicating pregnancy, first trimester: Secondary | ICD-10-CM | POA: Diagnosis not present

## 2023-09-15 DIAGNOSIS — E860 Dehydration: Secondary | ICD-10-CM | POA: Diagnosis not present

## 2023-09-15 DIAGNOSIS — R1013 Epigastric pain: Secondary | ICD-10-CM | POA: Diagnosis not present

## 2023-09-15 LAB — COMPREHENSIVE METABOLIC PANEL
ALT: 26 U/L (ref 0–44)
AST: 21 U/L (ref 15–41)
Albumin: 4.1 g/dL (ref 3.5–5.0)
Alkaline Phosphatase: 55 U/L (ref 38–126)
Anion gap: 14 (ref 5–15)
BUN: 9 mg/dL (ref 6–20)
CO2: 18 mmol/L — ABNORMAL LOW (ref 22–32)
Calcium: 9.9 mg/dL (ref 8.9–10.3)
Chloride: 104 mmol/L (ref 98–111)
Creatinine, Ser: 0.58 mg/dL (ref 0.44–1.00)
GFR, Estimated: >60 mL/min (ref >60)
Glucose, Bld: 79 mg/dL (ref 70–99)
Potassium: 3.2 mmol/L — ABNORMAL LOW (ref 3.5–5.1)
Sodium: 136 mmol/L (ref 135–145)
Total Bilirubin: 1.8 mg/dL — ABNORMAL HIGH (ref <1.2)
Total Protein: 7.8 g/dL (ref 6.5–8.1)

## 2023-09-15 LAB — CBC WITH DIFFERENTIAL/PLATELET
Abs Immature Granulocytes: 0.09 10*3/uL — ABNORMAL HIGH (ref 0.00–0.07)
Basophils Absolute: 0 10*3/uL (ref 0.0–0.1)
Basophils Relative: 0 %
Eosinophils Absolute: 0.1 10*3/uL (ref 0.0–0.5)
Eosinophils Relative: 0 %
HCT: 40.4 % (ref 36.0–46.0)
Hemoglobin: 13.9 g/dL (ref 12.0–15.0)
Immature Granulocytes: 1 %
Lymphocytes Relative: 10 %
Lymphs Abs: 1.5 10*3/uL (ref 0.7–4.0)
MCH: 29.3 pg (ref 26.0–34.0)
MCHC: 34.4 g/dL (ref 30.0–36.0)
MCV: 85.1 fL (ref 80.0–100.0)
Monocytes Absolute: 0.9 10*3/uL (ref 0.1–1.0)
Monocytes Relative: 6 %
Neutro Abs: 13.1 10*3/uL — ABNORMAL HIGH (ref 1.7–7.7)
Neutrophils Relative %: 83 %
Platelets: 272 10*3/uL (ref 150–400)
RBC: 4.75 MIL/uL (ref 3.87–5.11)
RDW: 13 % (ref 11.5–15.5)
WBC: 15.7 10*3/uL — ABNORMAL HIGH (ref 4.0–10.5)
nRBC: 0 % (ref 0.0–0.2)

## 2023-09-15 LAB — URINALYSIS, ROUTINE W REFLEX MICROSCOPIC
Bilirubin Urine: NEGATIVE
Glucose, UA: NEGATIVE mg/dL
Ketones, ur: 80 mg/dL — AB
Leukocytes,Ua: NEGATIVE
Nitrite: NEGATIVE
Protein, ur: 100 mg/dL — AB
Specific Gravity, Urine: 1.034 — ABNORMAL HIGH (ref 1.005–1.030)
pH: 6 (ref 5.0–8.0)

## 2023-09-15 LAB — LIPASE, BLOOD: Lipase: 35 U/L (ref 11–51)

## 2023-09-15 LAB — AMYLASE: Amylase: 89 U/L (ref 28–100)

## 2023-09-15 MED ORDER — ONDANSETRON HCL 4 MG/2ML IJ SOLN
4.0000 mg | Freq: Once | INTRAMUSCULAR | Status: AC
  Administered 2023-09-15: 4 mg via INTRAVENOUS
  Filled 2023-09-15: qty 2

## 2023-09-15 MED ORDER — PANTOPRAZOLE SODIUM 20 MG PO TBEC: 20.0000 mg | DELAYED_RELEASE_TABLET | Freq: Every day | ORAL | 0 refills | Status: DC

## 2023-09-15 MED ORDER — GLYCOPYRROLATE 1 MG PO TABS
1.0000 mg | ORAL_TABLET | Freq: Once | ORAL | Status: AC
  Administered 2023-09-15: 1 mg via ORAL
  Filled 2023-09-15: qty 1

## 2023-09-15 MED ORDER — SODIUM CHLORIDE 0.9 % IV SOLN
25.0000 mg | Freq: Once | INTRAVENOUS | Status: AC
  Administered 2023-09-15: 25 mg via INTRAVENOUS
  Filled 2023-09-15: qty 1

## 2023-09-15 MED ORDER — FAMOTIDINE IN NACL 20-0.9 MG/50ML-% IV SOLN
20.0000 mg | Freq: Once | INTRAVENOUS | Status: AC
  Administered 2023-09-15: 20 mg via INTRAVENOUS
  Filled 2023-09-15: qty 50

## 2023-09-15 MED ORDER — LACTATED RINGERS IV BOLUS
1000.0000 mL | Freq: Once | INTRAVENOUS | Status: AC
  Administered 2023-09-15: 1000 mL via INTRAVENOUS

## 2023-09-15 MED ORDER — VITAMIN B-6 50 MG PO TABS: 50.0000 mg | ORAL_TABLET | Freq: Every day | ORAL | 1 refills | Status: DC

## 2023-09-15 NOTE — MAU Note (Signed)
.  Lauren [name] is a 27 y.o. at [redacted]w[redacted]d here in MAU reporting: Ongoing N/V. She reports she has tried to take her Zofran but reports she cannot tolerate the taste. Last took the Zofran at 0630 and the Protonix at 0700. She reports her OB told her today to stop taking both the Pepcid and Protonix. She is also reporting epigastric pain that radiates to the right side of her upper abdomen. She reports vomiting green and white "foam." She is also reporting heart palpitations. She states when she walks or moves she begins to feel light headed and as if she is going to pass out. Denies VB.   Appointment tomorrow with [facility].   Last doses: Protonix this morning around 0700 Zofran ODT 0630   Onset of complaint: ongoing Pain score: 10/10 epigastric    FHT: n/a Lab orders placed from triage: UA - reports she is unable to leave a urine sample at this time

## 2023-09-15 NOTE — MAU Provider Note (Cosign Needed Addendum)
History     CSN: [id number]  Arrival date and time: 09/15/23 1527   Event Date/Time   First Provider Initiated Contact with Patient 09/15/2023  4:30 PM   Chief Complaint  Patient presents with   Nausea   Emesis   Dizziness   Abdominal Pain   Palpitations    HPI  Lauren [name] is a 27 y.o. G3P0020 at [redacted]w[redacted]d who presents to the MAU for nausea, vomiting, and abdominal pain. Patient presented for nausea and vomiting on 11/4, where she was given Zofran, Scopolamine, and Pepcid with relief of symptoms. Pt saw OB, who advised she take Protonix, which she took today. Unable to tolerate Zofran due to taste and Scopolamine patch caused rash. She has been vomiting constantly, describes foamy white emesis with retching, some green emesis as well. No bloody emesis reported. Having right upper quadrant and epigastric pain which has been ongoing for her. No change in bowel habits. No fevers, chills.   Past Medical History:  Diagnosis Date   Dysrhythmia    Migraines     Past Surgical History:  Procedure Laterality Date   ADENOIDECTOMY W/ MYRINGOTOMY  1660   27 Years old    TONSILLECTOMY  6436   27 Years old    TYMPANOSTOMY TUBE PLACEMENT Bilateral 1999, 20058, 5940   73 weeks old, 27 year old and 27 years old    WISDOM TOOTH EXTRACTION  1120   27 Years old     Family History  Problem Relation Age of Onset   Migraines Mother    Menstrual problems Mother    Diabetes Mother    Anxiety disorder Mother        xanax   Depression Mother        prozac   Thyroid disease Sister    Diabetes Maternal Grandmother    Dementia Maternal Grandmother        Died at 20   Diabetes Maternal Grandfather    Emphysema Maternal Grandfather        Died at 36   Febrile seizures Brother        Died at the age of 46 years old   Pneumonia Paternal Grandmother        Died at 73   Heart failure Paternal Grandfather        Died at 77    Social History   Tobacco Use   Smoking status: Former    Current  packs/day: 0.00    Types: Cigarettes    Quit date: 2021    Years since quitting: 3.8   Smokeless tobacco: Never   Tobacco comments:    Mom smokes   Vaping Use   Vaping status: Former   Substances: Nicotine  Substance Use Topics   Alcohol use: Not Currently    Comment: occ   Drug use: No    Allergies:  Allergies  Allergen Reactions   Codeine Palpitations and Other (See Comments)    Speeds up heart rate   Prednisone Other (See Comments)    Extreme crying Back breaks out    Septra [Bactrim] Nausea And Vomiting   Sulfa Antibiotics Anaphylaxis and Nausea And Vomiting   Other Itching, Swelling and Rash    TACO SEASONING (MARCUM BRAND FROM [employer])   Tape Dermatitis    Red and irritation     Medications Prior to Admission  Medication Sig Dispense Refill Last Dose   ondansetron (ZOFRAN-ODT) 4 MG disintegrating tablet Take 1 tablet (4 mg total) by mouth every  8 (eight) hours as needed for nausea or vomiting. 15 tablet 0 09/15/2023   pantoprazole (PROTONIX) 20 MG tablet Take 1 tablet (20 mg total) by mouth daily. 60 tablet 2 09/15/2023   amoxicillin-clavulanate (AUGMENTIN) 875-125 MG tablet Take 1 tablet by mouth every 12 (twelve) hours. (Patient not taking: Reported on 11/05/2022) 14 tablet 0    benzonatate (TESSALON) 100 MG capsule Take 1 capsule (100 mg total) by mouth every 8 (eight) hours. 21 capsule 0    cetirizine (ZYRTEC) 10 MG tablet Take 10 mg by mouth daily.      famotidine (PEPCID) 20 MG tablet Take 1 tablet (20 mg total) by mouth 2 (two) times daily. 60 tablet 2    fluconazole (DIFLUCAN) 150 MG tablet Take 1 tablet (150 mg total) by mouth daily. (Patient not taking: Reported on 11/05/2022) 1 tablet 0    ibuprofen (ADVIL) 800 MG tablet Take 1 tablet (800 mg total) by mouth 3 (three) times daily. (Patient not taking: Reported on 01/14/2022) 21 tablet 0    loratadine-pseudoephedrine (CLARITIN-D 12-HOUR) 5-120 MG tablet Take by mouth. (Patient not taking: Reported on  11/05/2022)      omeprazole (PRILOSEC) 40 MG capsule Take 1 capsule (40 mg total) by mouth daily. Take shortly before breakfast meal each day. (Patient not taking: Reported on 11/05/2022) 30 capsule 6    promethazine-dextromethorphan (PROMETHAZINE-DM) 6.25-15 MG/5ML syrup Take 5 mLs by mouth at bedtime as needed for cough. 118 mL 0     ROS reviewed and pertinent positives and negatives as documented in HPI.  Physical Exam   Blood pressure 117/69, pulse 71, temperature 98.5 F (36.9 C), temperature source Oral, resp. rate 16, height 5\' 4"  (1.626 m), weight 67.9 kg, last menstrual period 10/24/2022, SpO2 100%, unknown if currently breastfeeding.  Physical Exam Constitutional:      General: She is not in acute distress.    Appearance: Normal appearance. She is not ill-appearing.  HENT:     Head: Normocephalic and atraumatic.  Cardiovascular:     Rate and Rhythm: Normal rate.  Pulmonary:     Effort: Pulmonary effort is normal.     Breath sounds: Normal breath sounds.  Abdominal:     Palpations: Abdomen is soft.     Tenderness: There is abdominal tenderness in the right upper quadrant. There is no guarding or rebound. Positive signs include Murphy's sign.  Musculoskeletal:        General: Normal range of motion.  Skin:    General: Skin is warm and dry.     Findings: No rash.  Neurological:     General: No focal deficit present.     Mental Status: She is alert and oriented to person, place, and time.     MAU Course  Procedures  MDM 27 y.o. G3P0020 at [redacted]w[redacted]d presenting for nausea, vomiting, RUQ and epigastric pain. She was found to have elevated HR on initial presentation and orthostatic vitals reveal elevated HR with positional change (71 lying to 121 standing) which suggest hypovolemia. She is otherwise hemodynamically stable with exam notable for RUQ and epigastric TTP. CBC, CMP, amylase, lipase ordered along with RUQ U/S. Will give 1L LR, Pepcid, Phenergan, and Robinul for  increased secretions. Will re-eval.  8:30 PM  Fluids completed, pt attempted to sip water and had emesis following. CBC notable for elevated WBC with left shift and elevated Tbili. Awaiting RUQ U/S results. Pt discussed and care handed off to Wynelle Bourgeois, CNM.  Sundra Aland, MD OB Fellow, Faculty Practice  [facility]   Assessment and Plan  No diagnosis found.   Sundra Aland, MD OB Fellow, Faculty Practice [facility]  09/15/2023, 8:16 PM   Assumed care States Phenergan gave her restless legs and jitters Scopolamine patch caused a rash. States she was told not to take acid reducer Does not like taste of Zofran  Zofran given IV which did help Will start on Protonix and B6 to augment therapy  A:  Single IUP at [redacted]w[redacted]d       Nausea and vomiting      Dehydration  P   Discharge home      Rx Protonix for acid      Rx B6 for nausea      May swallow zofran      Advance diet as tolerated      Follow up with office Encouraged to return if she develops worsening of symptoms, increase in pain, fever, or other concerning symptoms.   Aviva Signs, CNM

## 2023-09-22 ENCOUNTER — Encounter (HOSPITAL_COMMUNITY): Payer: Self-pay | Admitting: Obstetrics and Gynecology

## 2023-09-22 ENCOUNTER — Other Ambulatory Visit: Payer: Self-pay

## 2023-09-22 ENCOUNTER — Inpatient Hospital Stay (HOSPITAL_COMMUNITY)
Admission: AD | Admit: 2023-09-22 | Discharge: 2023-09-23 | Disposition: A | Discharge status: Home / Self Care | Payer: Medicaid Other | Attending: Obstetrics & Gynecology | Admitting: Obstetrics & Gynecology

## 2023-09-22 DIAGNOSIS — E860 Dehydration: Secondary | ICD-10-CM

## 2023-09-22 DIAGNOSIS — Z3A09 9 weeks gestation of pregnancy: Secondary | ICD-10-CM | POA: Diagnosis not present

## 2023-09-22 DIAGNOSIS — O99611 Diseases of the digestive system complicating pregnancy, first trimester: Secondary | ICD-10-CM | POA: Diagnosis not present

## 2023-09-22 DIAGNOSIS — K219 Gastro-esophageal reflux disease without esophagitis: Secondary | ICD-10-CM | POA: Insufficient documentation

## 2023-09-22 DIAGNOSIS — O211 Hyperemesis gravidarum with metabolic disturbance: Secondary | ICD-10-CM | POA: Diagnosis not present

## 2023-09-22 DIAGNOSIS — R10812 Left upper quadrant abdominal tenderness: Secondary | ICD-10-CM | POA: Diagnosis present

## 2023-09-22 DIAGNOSIS — O210 Mild hyperemesis gravidarum: Secondary | ICD-10-CM | POA: Diagnosis not present

## 2023-09-22 DIAGNOSIS — R1110 Vomiting, unspecified: Secondary | ICD-10-CM | POA: Diagnosis present

## 2023-09-22 DIAGNOSIS — R10811 Right upper quadrant abdominal tenderness: Secondary | ICD-10-CM | POA: Diagnosis present

## 2023-09-22 LAB — COMPREHENSIVE METABOLIC PANEL
ALT: 20 U/L (ref 0–44)
AST: 18 U/L (ref 15–41)
Albumin: 3.8 g/dL (ref 3.5–5.0)
Alkaline Phosphatase: 46 U/L (ref 38–126)
Anion gap: 11 (ref 5–15)
BUN: 5 mg/dL — ABNORMAL LOW (ref 6–20)
CO2: 21 mmol/L — ABNORMAL LOW (ref 22–32)
Calcium: 9.7 mg/dL (ref 8.9–10.3)
Chloride: 101 mmol/L (ref 98–111)
Creatinine, Ser: 0.49 mg/dL (ref 0.44–1.00)
GFR, Estimated: >60 mL/min (ref >60)
Glucose, Bld: 79 mg/dL (ref 70–99)
Potassium: 3.6 mmol/L (ref 3.5–5.1)
Sodium: 133 mmol/L — ABNORMAL LOW (ref 135–145)
Total Bilirubin: 1 mg/dL (ref <1.2)
Total Protein: 7.3 g/dL (ref 6.5–8.1)

## 2023-09-22 LAB — URINALYSIS, ROUTINE W REFLEX MICROSCOPIC
Bilirubin Urine: NEGATIVE
Glucose, UA: NEGATIVE mg/dL
Hgb urine dipstick: NEGATIVE
Ketones, ur: 80 mg/dL — AB
Leukocytes,Ua: NEGATIVE
Nitrite: NEGATIVE
Protein, ur: 30 mg/dL — AB
Specific Gravity, Urine: 1.027 (ref 1.005–1.030)
pH: 5 (ref 5.0–8.0)

## 2023-09-22 MED ORDER — DEXTROSE-SODIUM CHLORIDE 5-0.45 % IV SOLN: Freq: Once | INTRAVENOUS | Status: AC

## 2023-09-22 MED ORDER — ONDANSETRON 4 MG PO TBDP: 8.0000 mg | ORAL_TABLET | Freq: Once | ORAL | Status: DC

## 2023-09-22 MED ORDER — VITAMIN B-6 100 MG PO TABS
100.0000 mg | ORAL_TABLET | Freq: Once | ORAL | Status: DC
  Filled 2023-09-22: qty 1

## 2023-09-22 MED ORDER — LACTATED RINGERS IV SOLN: Freq: Once | INTRAVENOUS | Status: AC

## 2023-09-22 MED ORDER — PYRIDOXINE HCL 100 MG/ML IJ SOLN
100.0000 mg | Freq: Once | INTRAMUSCULAR | Status: AC
  Administered 2023-09-22: 100 mg via INTRAVENOUS
  Filled 2023-09-22: qty 1

## 2023-09-22 MED ORDER — ONDANSETRON HCL 4 MG/2ML IJ SOLN
4.0000 mg | Freq: Once | INTRAMUSCULAR | Status: AC
  Administered 2023-09-22: 4 mg via INTRAVENOUS
  Filled 2023-09-22: qty 2

## 2023-09-22 NOTE — MAU Provider Note (Signed)
Chief Complaint: Nausea and Emesis   Event Date/Time   First Provider Initiated Contact with Patient 09/22/23 2043        SUBJECTIVE HPI: Lauren [name] is a 27 y.o. G3P0020 at [redacted]w[redacted]d by LMP who presents to maternity admissions reporting nausea and vomiting with acid reflux.  Has been treated for this before and states she ran out of meds.  States meds stopped working.  Requesting ultrasound, states has never had one. States needs Rx for acid reflux (I gave her one for Protonix last time she was here). She denies vaginal bleeding, urinary symptoms, h/a, or  fever/chills.     Emesis  This is a recurrent problem. The current episode started 1 to 4 weeks ago. There has been no fever. Associated symptoms include abdominal pain (epigastric, c/w acid reflux) and weight loss. Pertinent negatives include no chills, diarrhea, fever or myalgias. Treatments tried: zofran and pepcid. The treatment provided no relief.   RN Note: .Lauren [name] is a 27 y.o. at [redacted]w[redacted]d here in MAU reporting: can't keep anything down. States medication has not been helping. Today N/V has gotten worse - not able to tolerate anything. Reports emesis x8. Reports soreness in upper abdomen. Denies lower abdominal pain, VB, or abnormal discharge. Took zofran and pepcid at 0700. Took tylenol at 1300. States she ran out of nausea medications.   Past Medical History:  Diagnosis Date   Dysrhythmia    Migraines    Past Surgical History:  Procedure Laterality Date   ADENOIDECTOMY W/ MYRINGOTOMY  5772   28 Years old    TONSILLECTOMY  3330   27 Years old    TYMPANOSTOMY TUBE PLACEMENT Bilateral 1999, 200108, 8284   42 weeks old, 27 year old and 27 years old    WISDOM TOOTH EXTRACTION  6274   27 Years old    Social History   Socioeconomic History   Marital status: Single    Spouse name: Not on file   Number of children: 0   Years of education: Not on file   Highest education level: Not on file  Occupational History   Not on file   Tobacco Use   Smoking status: Former    Current packs/day: 0.00    Types: Cigarettes    Quit date: 2021    Years since quitting: 3.8   Smokeless tobacco: Never   Tobacco comments:    Mom smokes   Vaping Use   Vaping status: Former   Substances: Nicotine  Substance and Sexual Activity   Alcohol use: Not Currently    Comment: occ   Drug use: No   Sexual activity: Not Currently    Birth control/protection: None  Other Topics Concern   Not on file  Social History Narrative   Not on file   Social Determinants of Health   Financial Resource Strain: Not on file  Food Insecurity: Not on file  Transportation Needs: Not on file  Physical Activity: Not on file  Stress: Not on file  Social Connections: Unknown (03/20/2022)   Received from [facility], [facility]   Social Network    Social Network: Not on file  Intimate Partner Violence: Unknown (02/10/2022)   Received from [facility], [facility]   HITS    Physically Hurt: Not on file    Insult or Talk Down To: Not on file    Threaten Physical Harm: Not on file    Scream or Curse: Not on file   No current facility-administered  medications on file prior to encounter.   Current Outpatient Medications on File Prior to Encounter  Medication Sig Dispense Refill   acetaminophen (TYLENOL) 325 MG tablet Take 500 mg by mouth every 6 (six) hours as needed for headache.     ondansetron (ZOFRAN-ODT) 4 MG disintegrating tablet Take 1 tablet (4 mg total) by mouth every 8 (eight) hours as needed for nausea or vomiting. 15 tablet 0   pantoprazole (PROTONIX) 20 MG tablet Take 1 tablet (20 mg total) by mouth daily. 60 tablet 2   cetirizine (ZYRTEC) 10 MG tablet Take 10 mg by mouth daily.     loratadine-pseudoephedrine (CLARITIN-D 12-HOUR) 5-120 MG tablet Take by mouth. (Patient not taking: Reported on 11/05/2022)     pantoprazole (PROTONIX) 20 MG tablet Take 1 tablet (20 mg total) by mouth daily. 30 tablet 0    promethazine-dextromethorphan (PROMETHAZINE-DM) 6.25-15 MG/5ML syrup Take 5 mLs by mouth at bedtime as needed for cough. 118 mL 0   pyridOXINE (VITAMIN B6) 50 MG tablet Take 1 tablet (50 mg total) by mouth daily. 30 tablet 1   Allergies  Allergen Reactions   Codeine Palpitations and Other (See Comments)    Speeds up heart rate   Prednisone Other (See Comments)    Extreme crying Back breaks out    Septra [Bactrim] Nausea And Vomiting   Sulfa Antibiotics Anaphylaxis and Nausea And Vomiting   Other Itching, Swelling and Rash    TACO SEASONING (MARCUM BRAND FROM [employer])   Tape Dermatitis    Red and irritation     I have reviewed patient's Past Medical Hx, Surgical Hx, Family Hx, Social Hx, medications and allergies.   ROS:  Review of Systems  Constitutional:  Positive for weight loss. Negative for chills and fever.  Gastrointestinal:  Positive for abdominal pain (epigastric, c/w acid reflux) and vomiting. Negative for diarrhea.  Musculoskeletal:  Negative for myalgias.   Review of Systems  Other systems negative   Physical Exam  Physical Exam Patient Vitals for the past 24 hrs:  BP Temp Temp src Pulse Resp SpO2 Height Weight  09/22/23 2035 123/82 -- -- 71 -- -- -- --  09/22/23 2014 122/78 98.1 F (36.7 C) Oral 86 17 99 % 5\' 4"  (1.626 m) 67.2 kg   Constitutional: Well-developed female in no acute distress.  Cardiovascular: normal rate Respiratory: normal effort GI: Abd soft, non-tender.  MS: Extremities nontender, no edema, normal ROM Neurologic: Alert and oriented x 4.  GU: Neg CVAT.  LAB RESULTS Results for orders placed or performed during the hospital encounter of 09/22/23 (from the past 24 hour(s))  Urinalysis, Routine w reflex microscopic -Urine, Clean Catch     Status: Abnormal   Collection Time: 09/22/23  8:18 PM  Result Value Ref Range   Color, Urine YELLOW YELLOW   APPearance HAZY (A) CLEAR   Specific Gravity, Urine 1.027 1.005 - 1.030   pH 5.0 5.0 -  8.0   Glucose, UA NEGATIVE NEGATIVE mg/dL   Hgb urine dipstick NEGATIVE NEGATIVE   Bilirubin Urine NEGATIVE NEGATIVE   Ketones, ur 80 (A) NEGATIVE mg/dL   Protein, ur 30 (A) NEGATIVE mg/dL   Nitrite NEGATIVE NEGATIVE   Leukocytes,Ua NEGATIVE NEGATIVE   RBC / HPF 0-5 0 - 5 RBC/hpf   WBC, UA 0-5 0 - 5 WBC/hpf   Bacteria, UA FEW (A) NONE SEEN   Squamous Epithelial / HPF 11-20 0 - 5 /HPF   Mucus PRESENT   Comprehensive metabolic panel  Status: Abnormal   Collection Time: 09/22/23  9:07 PM  Result Value Ref Range   Sodium 133 (L) 135 - 145 mmol/L   Potassium 3.6 3.5 - 5.1 mmol/L   Chloride 101 98 - 111 mmol/L   CO2 21 (L) 22 - 32 mmol/L   Glucose, Bld 79 70 - 99 mg/dL   BUN 5 (L) 6 - 20 mg/dL   Creatinine, Ser 1.61 0.44 - 1.00 mg/dL   Calcium 9.7 8.9 - 09.6 mg/dL   Total Protein 7.3 6.5 - 8.1 g/dL   Albumin 3.8 3.5 - 5.0 g/dL   AST 18 15 - 41 U/L   ALT 20 0 - 44 U/L   Alkaline Phosphatase 46 38 - 126 U/L   Total Bilirubin 1.0 <1.2 mg/dL   GFR, Estimated >04 >54 mL/min   Anion gap 11 5 - 15       IMAGING RUQ US done last week, negative  Bedside US done for verification of fetal life Pt informed that the ultrasound is considered a limited OB ultrasound and is not intended to be a complete ultrasound exam.  Patient also informed that the ultrasound is not being completed with the intent of assessing for fetal or placental anomalies or any pelvic abnormalities.  Explained that the purpose of today's ultrasound is to assess for fetal life.  Patient acknowledges the purpose of the exam and the limitations of the study.    Single live fetus seen in normal appearing gestational sac Measures 10 weeks 0 days + fetal movement FHR 150s.    MAU Management/MDM: I have reviewed the triage vital signs and the nursing notes.   Pertinent labs & imaging results that were available during my care of the patient were reviewed by me and considered in my medical decision making (see chart  for details).      I have reviewed her medical records including past results, notes and treatments. Medical, Surgical, and family history were reviewed.  Medications and recent lab tests were reviewed  Ordered IV fluids and zofran .  Vomited before she could be dosed with vitamin B6 so we gave it IV.  Second bag of fluids given with Tums given for reflux. She was then able to get relief from epigastric discomfort and able to tolerate PO intake   ASSESSMENT Single IUP at [redacted]w[redacted]d Hyperemesis Dehydration  2.2kg weight loss over 10 days  PLAN Discharge home Rx Zofran sent (refill) Advance diet as tolerated May benefit from scheduled outpatient infusions (pt to call office to request)  Pt stable at time of discharge. Encouraged to return here if she develops worsening of symptoms, increase in pain, fever, or other concerning symptoms.    Wynelle Bourgeois CNM, MSN Certified Nurse-Midwife 09/22/2023  8:43 PM

## 2023-09-22 NOTE — MAU Note (Signed)
.  Lauren [name] is a 27 y.o. at [redacted]w[redacted]d here in MAU reporting: can't keep anything down. States medication has not been helping. Today N/V has gotten worse - not able to tolerate anything. Reports emesis x8. Reports soreness in upper abdomen. Denies lower abdominal pain, VB, or abnormal discharge. Took zofran and pepcid at 0700. Took tylenol at 1300. States she ran out of nausea medications.   Pain score: 9 Vitals:   09/22/23 2014  BP: 122/78  Pulse: 86  Resp: 17  Temp: 98.1 F (36.7 C)  SpO2: 99%     FHT:NA Lab orders placed from triage:  UA

## 2023-09-23 DIAGNOSIS — O210 Mild hyperemesis gravidarum: Secondary | ICD-10-CM

## 2023-09-23 DIAGNOSIS — Z3A09 9 weeks gestation of pregnancy: Secondary | ICD-10-CM

## 2023-09-23 DIAGNOSIS — E860 Dehydration: Secondary | ICD-10-CM

## 2023-09-23 MED ORDER — ONDANSETRON HCL 4 MG PO TABS: 4.0000 mg | ORAL_TABLET | Freq: Four times a day (QID) | ORAL | 2 refills | Status: DC | PRN

## 2023-09-23 MED ORDER — CALCIUM CARBONATE ANTACID 500 MG PO CHEW
2.0000 | CHEWABLE_TABLET | Freq: Once | ORAL | Status: AC
  Administered 2023-09-23: 400 mg via ORAL
  Filled 2023-09-23: qty 2

## 2023-09-25 ENCOUNTER — Encounter (HOSPITAL_COMMUNITY): Payer: Self-pay | Admitting: Obstetrics and Gynecology

## 2023-09-25 ENCOUNTER — Inpatient Hospital Stay (HOSPITAL_COMMUNITY)
Admission: AD | Admit: 2023-09-25 | Discharge: 2023-09-25 | Disposition: A | Discharge status: Home / Self Care | Payer: Medicaid Other | Attending: Family Medicine | Admitting: Family Medicine

## 2023-09-25 DIAGNOSIS — O26891 Other specified pregnancy related conditions, first trimester: Secondary | ICD-10-CM | POA: Insufficient documentation

## 2023-09-25 DIAGNOSIS — R519 Headache, unspecified: Secondary | ICD-10-CM | POA: Insufficient documentation

## 2023-09-25 DIAGNOSIS — Z3A10 10 weeks gestation of pregnancy: Secondary | ICD-10-CM

## 2023-09-25 DIAGNOSIS — O219 Vomiting of pregnancy, unspecified: Secondary | ICD-10-CM | POA: Diagnosis not present

## 2023-09-25 DIAGNOSIS — Z20818 Contact with and (suspected) exposure to other bacterial communicable diseases: Secondary | ICD-10-CM | POA: Diagnosis present

## 2023-09-25 DIAGNOSIS — R6889 Other general symptoms and signs: Secondary | ICD-10-CM | POA: Diagnosis not present

## 2023-09-25 DIAGNOSIS — K5900 Constipation, unspecified: Secondary | ICD-10-CM | POA: Diagnosis present

## 2023-09-25 DIAGNOSIS — R509 Fever, unspecified: Secondary | ICD-10-CM | POA: Diagnosis not present

## 2023-09-25 DIAGNOSIS — R1110 Vomiting, unspecified: Secondary | ICD-10-CM | POA: Diagnosis present

## 2023-09-25 DIAGNOSIS — R079 Chest pain, unspecified: Secondary | ICD-10-CM | POA: Diagnosis not present

## 2023-09-25 DIAGNOSIS — M549 Dorsalgia, unspecified: Secondary | ICD-10-CM | POA: Insufficient documentation

## 2023-09-25 DIAGNOSIS — R0981 Nasal congestion: Secondary | ICD-10-CM | POA: Diagnosis not present

## 2023-09-25 HISTORY — DX: Depression, unspecified: F32.A

## 2023-09-25 HISTORY — DX: Urinary tract infection, site not specified: N39.0

## 2023-09-25 HISTORY — DX: Anxiety disorder, unspecified: F41.9

## 2023-09-25 LAB — URINALYSIS, ROUTINE W REFLEX MICROSCOPIC
Bilirubin Urine: NEGATIVE
Glucose, UA: NEGATIVE mg/dL
Ketones, ur: 80 mg/dL — AB
Nitrite: NEGATIVE
Protein, ur: NEGATIVE mg/dL
Specific Gravity, Urine: 1.026 (ref 1.005–1.030)
pH: 5 (ref 5.0–8.0)

## 2023-09-25 LAB — RESP PANEL BY RT-PCR (RSV, FLU A&B, COVID)  RVPGX2
Influenza A by PCR: NEGATIVE
Influenza B by PCR: NEGATIVE
Resp Syncytial Virus by PCR: NEGATIVE
SARS Coronavirus 2 by RT PCR: NEGATIVE

## 2023-09-25 NOTE — MAU Provider Note (Addendum)
History     CSN: [id number]  Arrival date and time: 09/25/23 1709   Event Date/Time   First Provider Initiated Contact with Patient 09/25/23 1842      Chief Complaint  Patient presents with   Emesis   Nausea   Back Pain   Chest Pain   Patient presents to MAU with complaints of nausea vomiting, chest & back pain on inspiration, headache, low grade fever 100.5 this morning, stuffy nose. She reports someone in her home recently diagnosed with pneumonia. Mom is at bedside contributing to her care.   Emesis  Associated symptoms include chest pain, coughing and a fever.  Back Pain Associated symptoms include chest pain and a fever.  Chest Pain  Associated symptoms include back pain, a cough, a fever and vomiting.    OB History     Gravida  3   Para  0   Term  0   Preterm  0   AB  2   Living  0      SAB  2   IAB  0   Ectopic  0   Multiple  0   Live Births  0           Past Medical History:  Diagnosis Date   Anxiety    Depression    Dysrhythmia    Migraines    UTI (urinary tract infection)     Past Surgical History:  Procedure Laterality Date   ADENOIDECTOMY W/ MYRINGOTOMY  2572   27 Years old    TONSILLECTOMY  4713   27 Years old    TYMPANOSTOMY TUBE PLACEMENT Bilateral 1999, 20054, 4234   30 weeks old, 27 year old and 27 years old    WISDOM TOOTH EXTRACTION  3163   27 Years old     Family History  Problem Relation Age of Onset   Migraines Mother    Menstrual problems Mother    Diabetes Mother    Anxiety disorder Mother        xanax   Depression Mother        prozac   Emphysema Mother    COPD Father    Bipolar disorder Father    Thyroid disease Sister    Febrile seizures Brother        Died at the age of 67 years old   Diabetes Maternal Grandmother    Dementia Maternal Grandmother        Died at 38   Diabetes Maternal Grandfather    Emphysema Maternal Grandfather        Died at 64   Pneumonia Paternal Grandmother        Died at  27   Heart failure Paternal Grandfather        Died at 78    Social History   Tobacco Use   Smoking status: Former    Current packs/day: 0.00    Types: Cigarettes    Quit date: 2021    Years since quitting: 3.8   Smokeless tobacco: Never   Tobacco comments:    Mom smokes   Vaping Use   Vaping status: Former   Substances: Nicotine  Substance Use Topics   Alcohol use: Not Currently    Comment: occ   Drug use: No    Allergies:  Allergies  Allergen Reactions   Codeine Palpitations and Other (See Comments)    Speeds up heart rate   Prednisone Other (See Comments)    Extreme crying Back breaks  out    Septra [Bactrim] Nausea And Vomiting   Sulfa Antibiotics Anaphylaxis and Nausea And Vomiting   Phenergan [Promethazine]     Bad side effects, muscle spasms and legs jerking   Other Itching, Swelling and Rash    TACO SEASONING (MARCUM BRAND FROM [employer])   Tape Dermatitis    Red and irritation     Medications Prior to Admission  Medication Sig Dispense Refill Last Dose   pantoprazole (PROTONIX) 20 MG tablet Take 1 tablet (20 mg total) by mouth daily. 30 tablet 0 09/25/2023 at 1000   acetaminophen (TYLENOL) 325 MG tablet Take 500 mg by mouth every 6 (six) hours as needed for headache.    at 1430   cetirizine (ZYRTEC) 10 MG tablet Take 10 mg by mouth daily.      loratadine-pseudoephedrine (CLARITIN-D 12-HOUR) 5-120 MG tablet Take by mouth. (Patient not taking: Reported on 11/05/2022)      ondansetron (ZOFRAN) 4 MG tablet Take 1 tablet (4 mg total) by mouth every 6 (six) hours as needed for nausea or vomiting. 30 tablet 2  at 1000   pantoprazole (PROTONIX) 20 MG tablet Take 1 tablet (20 mg total) by mouth daily. 60 tablet 2    promethazine-dextromethorphan (PROMETHAZINE-DM) 6.25-15 MG/5ML syrup Take 5 mLs by mouth at bedtime as needed for cough. 118 mL 0    pyridOXINE (VITAMIN B6) 50 MG tablet Take 1 tablet (50 mg total) by mouth daily. 30 tablet 1  at 1000    Review of  Systems  Constitutional:  Positive for fever.  HENT: Negative.    Eyes: Negative.   Respiratory:  Positive for cough and chest tightness.   Cardiovascular:  Positive for chest pain.  Gastrointestinal:  Positive for vomiting.  Endocrine: Negative.   Musculoskeletal:  Positive for back pain.  Allergic/Immunologic: Negative.   Neurological: Negative.   Hematological: Negative.   Psychiatric/Behavioral: Negative.     Physical Exam   Blood pressure 118/77, pulse 84, temperature 97.9 F (36.6 C), resp. rate 16, height 5\' 4"  (1.626 m), weight 65.3 kg, last menstrual period 10/24/2022, SpO2 99%, unknown if currently breastfeeding.  Physical Exam Cardiovascular:     Heart sounds: Normal heart sounds.  Pulmonary:     Breath sounds: Normal breath sounds.  Musculoskeletal:        General: Normal range of motion.  Skin:    General: Skin is warm and dry.  Neurological:     Mental Status: She is alert and oriented to person, place, and time.  Psychiatric:        Mood and Affect: Mood normal.    MAU Course  Procedures Results for orders placed or performed during the hospital encounter of 09/25/23 (from the past 24 hour(s))  Urinalysis, Routine w reflex microscopic -Urine, Clean Catch     Status: Abnormal   Collection Time: 09/25/23  6:32 PM  Result Value Ref Range   Color, Urine AMBER (A) YELLOW   APPearance HAZY (A) CLEAR   Specific Gravity, Urine 1.026 1.005 - 1.030   pH 5.0 5.0 - 8.0   Glucose, UA NEGATIVE NEGATIVE mg/dL   Hgb urine dipstick SMALL (A) NEGATIVE   Bilirubin Urine NEGATIVE NEGATIVE   Ketones, ur 80 (A) NEGATIVE mg/dL   Protein, ur NEGATIVE NEGATIVE mg/dL   Nitrite NEGATIVE NEGATIVE   Leukocytes,Ua TRACE (A) NEGATIVE   RBC / HPF 0-5 0 - 5 RBC/hpf   WBC, UA 6-10 0 - 5 WBC/hpf   Bacteria, UA RARE (A) NONE SEEN  Squamous Epithelial / HPF 6-10 0 - 5 /HPF   Mucus PRESENT    Amorphous Crystal PRESENT   Resp panel by RT-PCR (RSV, Flu A&B, Covid) Anterior Nasal Swab      Status: None   Collection Time: 09/25/23  7:03 PM   Specimen: Anterior Nasal Swab  Result Value Ref Range   SARS Coronavirus 2 by RT PCR NEGATIVE NEGATIVE   Influenza A by PCR NEGATIVE NEGATIVE   Influenza B by PCR NEGATIVE NEGATIVE   Resp Syncytial Virus by PCR NEGATIVE NEGATIVE    MDM Urinalysis  Respiratory panel    Assessment and Plan  Reassessment (8:34 PM) -Results as above.  -Provider to bedside to inform of findings. -Discussed management of symptoms with OTC medications. List provided.  -Patient requests and given list of community providers. -Encouraged to call primary office or return to MAU if symptoms worsen or with the onset of new symptoms. -Discharged to home in stable condition.  Cherre Robins MSN, CNM Advanced Practice Provider, [facility]

## 2023-09-25 NOTE — MAU Note (Addendum)
Lauren [name] is a 27 y.o. at [redacted]w[redacted]d here in MAU reporting: still not able to keep anything down, like nothin.   Chest and back hurt (upper), every time she breaths, it's like a stabbing in her back and chest. (Started the day after she left here) Nose is running.  One of the people she lives with, just got diagnosed with walking pneumonia. Her heart is beating fast and hard, feels like she can't catch her breath. Temp 100.5 this morning. Tooks some Tylenol around 1430. No diarrhea.  Hasn't had a bowel movement in almost a wk.   Onset of complaint: ongoing n/v Pain score: 9 Vitals:   09/25/23 1802 09/25/23 1804  BP:  118/77  Pulse:  84  Resp:  16  Temp:  97.9 F (36.6 C)  SpO2: 99% 99%     FHT:169 Lab orders placed from triage:  urine

## 2023-09-25 NOTE — Discharge Instructions (Signed)

## 2023-10-03 ENCOUNTER — Inpatient Hospital Stay (HOSPITAL_COMMUNITY)
Admission: AD | Admit: 2023-10-03 | Discharge: 2023-10-03 | Disposition: A | Discharge status: Home / Self Care | Payer: Medicaid Other | Attending: Obstetrics and Gynecology | Admitting: Obstetrics and Gynecology

## 2023-10-03 ENCOUNTER — Encounter (HOSPITAL_COMMUNITY): Payer: Self-pay | Admitting: Obstetrics and Gynecology

## 2023-10-03 DIAGNOSIS — Z3A11 11 weeks gestation of pregnancy: Secondary | ICD-10-CM

## 2023-10-03 DIAGNOSIS — E860 Dehydration: Secondary | ICD-10-CM | POA: Diagnosis not present

## 2023-10-03 DIAGNOSIS — O210 Mild hyperemesis gravidarum: Secondary | ICD-10-CM

## 2023-10-03 DIAGNOSIS — O26891 Other specified pregnancy related conditions, first trimester: Secondary | ICD-10-CM | POA: Diagnosis not present

## 2023-10-03 DIAGNOSIS — O99281 Endocrine, nutritional and metabolic diseases complicating pregnancy, first trimester: Secondary | ICD-10-CM | POA: Insufficient documentation

## 2023-10-03 LAB — COMPREHENSIVE METABOLIC PANEL
ALT: 40 U/L (ref 0–44)
AST: 28 U/L (ref 15–41)
Albumin: 3.8 g/dL (ref 3.5–5.0)
Alkaline Phosphatase: 55 U/L (ref 38–126)
Anion gap: 10 (ref 5–15)
BUN: 6 mg/dL (ref 6–20)
CO2: 21 mmol/L — ABNORMAL LOW (ref 22–32)
Calcium: 9.7 mg/dL (ref 8.9–10.3)
Chloride: 103 mmol/L (ref 98–111)
Creatinine, Ser: 0.47 mg/dL (ref 0.44–1.00)
GFR, Estimated: >60 mL/min (ref >60)
Glucose, Bld: 85 mg/dL (ref 70–99)
Potassium: 3.3 mmol/L — ABNORMAL LOW (ref 3.5–5.1)
Sodium: 134 mmol/L — ABNORMAL LOW (ref 135–145)
Total Bilirubin: 1 mg/dL (ref <1.2)
Total Protein: 7.7 g/dL (ref 6.5–8.1)

## 2023-10-03 LAB — URINALYSIS, ROUTINE W REFLEX MICROSCOPIC
Glucose, UA: NEGATIVE mg/dL
Hgb urine dipstick: NEGATIVE
Ketones, ur: 80 mg/dL — AB
Leukocytes,Ua: NEGATIVE
Nitrite: NEGATIVE
Protein, ur: 30 mg/dL — AB
Specific Gravity, Urine: 1.031 — ABNORMAL HIGH (ref 1.005–1.030)
pH: 5 (ref 5.0–8.0)

## 2023-10-03 MED ORDER — METHYLPREDNISOLONE 4 MG PO TABS: 4.0000 mg | ORAL_TABLET | Freq: Every day | ORAL | 0 refills | Status: DC

## 2023-10-03 MED ORDER — METHYLPREDNISOLONE SODIUM SUCC 125 MG IJ SOLR
125.0000 mg | Freq: Once | INTRAMUSCULAR | Status: AC
  Administered 2023-10-03: 125 mg via INTRAVENOUS
  Filled 2023-10-03: qty 2

## 2023-10-03 MED ORDER — METOCLOPRAMIDE HCL 10 MG PO TABS
10.0000 mg | ORAL_TABLET | Freq: Once | ORAL | Status: AC
  Administered 2023-10-03: 10 mg via ORAL
  Filled 2023-10-03: qty 1

## 2023-10-03 MED ORDER — SODIUM CHLORIDE 0.9 % IV SOLN: Freq: Once | INTRAVENOUS | Status: AC

## 2023-10-03 MED ORDER — ONDANSETRON HCL 4 MG/2ML IJ SOLN
4.0000 mg | Freq: Once | INTRAMUSCULAR | Status: AC
  Administered 2023-10-03: 4 mg via INTRAVENOUS
  Filled 2023-10-03: qty 2

## 2023-10-03 MED ORDER — METOCLOPRAMIDE HCL 10 MG PO TABS: 10.0000 mg | ORAL_TABLET | Freq: Three times a day (TID) | ORAL | 0 refills | Status: DC | PRN

## 2023-10-03 NOTE — MAU Note (Signed)
.  Lauren [name] is a 27 y.o. at [redacted]w[redacted]d here in MAU reporting: can't keep solid or liquids down-lightheaded and dizzy with ambulation.Denies diarrhea, Started on 11/04- reports weight loss of 25 lbs over the last month, Has zofran and protonix but states they are ineffective Reports urinating 2 times in 48 hours and has not had a BM since 11/16  Also has cough and took robitussin at 1800 Onset of complaint: 11/04 Pain score: generalized soreness Vitals:   10/03/23 1922  BP: 109/87  Pulse: (!) 102  Resp: 17  Temp: 98.9 F (37.2 C)     [facility]:XNATFT to obtain-will reposition pt in pt room Lab orders placed from triage:  UA

## 2023-10-03 NOTE — MAU Provider Note (Signed)
Chief Complaint: Nausea and Emesis   Event Date/Time   First Provider Initiated Contact with Patient 10/03/23 2016        SUBJECTIVE HPI: Lauren [name] is a 27 y.o. G3P0020 at [redacted]w[redacted]d by LMP who presents to maternity admissions reporting recurrent nausea and vomiting. Meds not helping. Has lost 6 kg this month.  States her office told her they would not [facility], states they told her to "call around". . She denies vaginal bleeding, urinary symptoms, h/a,or fever/chills.    Emesis  This is a recurrent problem. The problem has been unchanged. There has been no fever. Pertinent negatives include no abdominal pain, chills, diarrhea or fever. Treatments tried: zithromax. The treatment provided no relief.   RN Note: Lauren [name] is a 27 y.o. at [redacted]w[redacted]d here in MAU reporting: can't keep solid or liquids down-lightheaded and dizzy with ambulation.Denies diarrhea, Started on 11/04- reports weight loss of 25 lbs over the last month, Has zofran and protonix but states they are ineffective Reports urinating 2 times in 48 hours and has not had a BM since 11/16  Also has cough and took robitussin at 1800 Onset of complaint: 11/04 Pain score: generalized soreness  Past Medical History:  Diagnosis Date   Anxiety    Depression    Dysrhythmia    Migraines    UTI (urinary tract infection)    Past Surgical History:  Procedure Laterality Date   ADENOIDECTOMY W/ MYRINGOTOMY  7419   26 Years old    TONSILLECTOMY  527   27 Years old    TYMPANOSTOMY TUBE PLACEMENT Bilateral 1999, 20069, 3662   67 weeks old, 27 year old and 27 years old    WISDOM TOOTH EXTRACTION  2432   27 Years old    Social History   Socioeconomic History   Marital status: Single    Spouse name: Not on file   Number of children: 0   Years of education: Not on file   Highest education level: Not on file  Occupational History   Not on file  Tobacco Use   Smoking status: Former    Current  packs/day: 0.00    Types: Cigarettes    Quit date: 2021    Years since quitting: 3.8   Smokeless tobacco: Never   Tobacco comments:    Mom smokes   Vaping Use   Vaping status: Former   Substances: Nicotine  Substance and Sexual Activity   Alcohol use: Not Currently    Comment: occ   Drug use: No   Sexual activity: Not Currently    Birth control/protection: None  Other Topics Concern   Not on file  Social History Narrative   Not on file   Social Determinants of Health   Financial Resource Strain: Not on file  Food Insecurity: Not on file  Transportation Needs: Not on file  Physical Activity: Not on file  Stress: Not on file  Social Connections: Unknown (03/20/2022)   [facility]   Social Network    Social Network: Not on file  Intimate Partner Violence: Unknown (02/10/2022)   Received from [facility]   HITS    Physically Hurt: Not on file    Insult or Talk Down To: Not on file    Threaten Physical Harm: Not on file    Scream or Curse: Not on file   No current facility-administered medications on file prior to encounter.   Current  Outpatient Medications on File Prior to Encounter  Medication Sig Dispense Refill   guaifenesin (ROBITUSSIN) 100 MG/5ML syrup Take 200 mg by mouth 3 (three) times daily as needed for cough.     ondansetron (ZOFRAN) 4 MG tablet Take 1 tablet (4 mg total) by mouth every 6 (six) hours as needed for nausea or vomiting. 30 tablet 2   pantoprazole (PROTONIX) 20 MG tablet Take 1 tablet (20 mg total) by mouth daily. 60 tablet 2   acetaminophen (TYLENOL) 325 MG tablet Take 500 mg by mouth every 6 (six) hours as needed for headache.     cetirizine (ZYRTEC) 10 MG tablet Take 10 mg by mouth daily.     loratadine-pseudoephedrine (CLARITIN-D 12-HOUR) 5-120 MG tablet Take by mouth. (Patient not taking: Reported on 11/05/2022)     pantoprazole (PROTONIX) 20 MG tablet Take 1 tablet (20 mg total) by mouth daily. 30  tablet 0   promethazine-dextromethorphan (PROMETHAZINE-DM) 6.25-15 MG/5ML syrup Take 5 mLs by mouth at bedtime as needed for cough. 118 mL 0   pyridOXINE (VITAMIN B6) 50 MG tablet Take 1 tablet (50 mg total) by mouth daily. 30 tablet 1   Allergies  Allergen Reactions   Codeine Palpitations and Other (See Comments)    Speeds up heart rate   Prednisone Other (See Comments)    Extreme crying Back breaks out    Septra [Bactrim] Nausea And Vomiting   Sulfa Antibiotics Anaphylaxis and Nausea And Vomiting   Phenergan [Promethazine]     Bad side effects, muscle spasms and legs jerking   Other Itching, Swelling and Rash    TACO SEASONING (MARCUM BRAND FROM [employer])   Tape Dermatitis    Red and irritation     I have reviewed patient's Past Medical Hx, Surgical Hx, Family Hx, Social Hx, medications and allergies.   ROS:  Review of Systems  Constitutional:  Negative for chills and fever.  Gastrointestinal:  Positive for vomiting. Negative for abdominal pain and diarrhea.   Review of Systems  Other systems negative   Physical Exam  Physical Exam Patient Vitals for the past 24 hrs:  BP Temp Temp src Pulse Resp Height Weight  10/03/23 1922 109/87 98.9 F (37.2 C) Oral (!) 102 17 5\' 4"  (1.626 m) 63.6 kg   Constitutional: Well-developed, well-nourished female in no acute distress.  Cardiovascular: normal rate Respiratory: normal effort GI: Abd soft, non-tender.  MS: Extremities nontender, no edema, normal ROM Neurologic: Alert and oriented x 4.  GU: Neg CVAT.  FHR 168  LAB RESULTS Results for orders placed or performed during the hospital encounter of 10/03/23 (from the past 24 hour(s))  Comprehensive metabolic panel     Status: Abnormal   Collection Time: 10/03/23  8:51 PM  Result Value Ref Range   Sodium 134 (L) 135 - 145 mmol/L   Potassium 3.3 (L) 3.5 - 5.1 mmol/L   Chloride 103 98 - 111 mmol/L   CO2 21 (L) 22 - 32 mmol/L   Glucose, Bld 85 70 - 99 mg/dL   BUN 6 6 - 20  mg/dL   Creatinine, Ser 1.61 0.44 - 1.00 mg/dL   Calcium 9.7 8.9 - 09.6 mg/dL   Total Protein 7.7 6.5 - 8.1 g/dL   Albumin 3.8 3.5 - 5.0 g/dL   AST 28 15 - 41 U/L   ALT 40 0 - 44 U/L   Alkaline Phosphatase 55 38 - 126 U/L   Total Bilirubin 1.0 <1.2 mg/dL   GFR, Estimated >04 >54 mL/min  Anion gap 10 5 - 15  Urinalysis, Routine w reflex microscopic -Urine, Clean Catch     Status: Abnormal   Collection Time: 10/03/23  9:49 PM  Result Value Ref Range   Color, Urine AMBER (A) YELLOW   APPearance CLOUDY (A) CLEAR   Specific Gravity, Urine 1.031 (H) 1.005 - 1.030   pH 5.0 5.0 - 8.0   Glucose, UA NEGATIVE NEGATIVE mg/dL   Hgb urine dipstick NEGATIVE NEGATIVE   Bilirubin Urine SMALL (A) NEGATIVE   Ketones, ur 80 (A) NEGATIVE mg/dL   Protein, ur 30 (A) NEGATIVE mg/dL   Nitrite NEGATIVE NEGATIVE   Leukocytes,Ua NEGATIVE NEGATIVE   RBC / HPF 0-5 0 - 5 RBC/hpf   WBC, UA 0-5 0 - 5 WBC/hpf   Bacteria, UA RARE (A) NONE SEEN   Squamous Epithelial / HPF 11-20 0 - 5 /HPF   Mucus PRESENT         IMAGING RUQ US done 09/15/23 neg for gallstones  MAU Management/MDM: I have reviewed the triage vital signs and the nursing notes.   Pertinent labs & imaging results that were available during my care of the patient were reviewed by me and considered in my medical decision making (see chart for details).      I have reviewed her medical records including past results, notes and treatments. Medical, Surgical, and family history were reviewed.  Medications and recent lab tests were reviewed    Treatments in MAU included IV hydration, Solumedrol, Reglan and zofran.  Formerly had a dystonic reaction to phenergan, but did not have it with PO Reglan today.  Will prescribe her this for home use. Instructed if she has any dystonic reactions, to take Benadryl.   ASSESSMENT Single IUP at [redacted]w[redacted]d Hyperemesis Dehydration  PLAN Discharge home Will add Rx Reglan for nausea Rx Medrol burst for 7  days Followup in office Info given on our clinic per pt request  Pt stable at time of discharge. Encouraged to return here if she develops worsening of symptoms, increase in pain, fever, or other concerning symptoms.    Wynelle Bourgeois CNM, MSN Certified Nurse-Midwife 10/03/2023  8:16 PM

## 2023-10-23 ENCOUNTER — Encounter (HOSPITAL_COMMUNITY): Payer: Self-pay | Admitting: Obstetrics and Gynecology

## 2023-10-23 ENCOUNTER — Inpatient Hospital Stay (HOSPITAL_COMMUNITY)
Admission: AD | Admit: 2023-10-23 | Discharge: 2023-10-23 | Disposition: A | Discharge status: Home / Self Care | Payer: Medicaid Other | Attending: Obstetrics and Gynecology | Admitting: Obstetrics and Gynecology

## 2023-10-23 DIAGNOSIS — O210 Mild hyperemesis gravidarum: Secondary | ICD-10-CM | POA: Insufficient documentation

## 2023-10-23 DIAGNOSIS — Z3A14 14 weeks gestation of pregnancy: Secondary | ICD-10-CM | POA: Diagnosis not present

## 2023-10-23 DIAGNOSIS — O26892 Other specified pregnancy related conditions, second trimester: Secondary | ICD-10-CM | POA: Diagnosis not present

## 2023-10-23 LAB — URINALYSIS, ROUTINE W REFLEX MICROSCOPIC
Bilirubin Urine: NEGATIVE
Glucose, UA: NEGATIVE mg/dL
Hgb urine dipstick: NEGATIVE
Ketones, ur: 80 mg/dL — AB
Leukocytes,Ua: NEGATIVE
Nitrite: NEGATIVE
Protein, ur: 30 mg/dL — AB
Specific Gravity, Urine: 1.03 (ref 1.005–1.030)
pH: 5 (ref 5.0–8.0)

## 2023-10-23 MED ORDER — METOCLOPRAMIDE HCL 10 MG PO TABS: 10.0000 mg | ORAL_TABLET | Freq: Three times a day (TID) | ORAL | 2 refills | Status: DC | PRN

## 2023-10-23 MED ORDER — METOCLOPRAMIDE HCL 5 MG/ML IJ SOLN
10.0000 mg | Freq: Once | INTRAMUSCULAR | Status: AC
  Administered 2023-10-23: 10 mg via INTRAVENOUS
  Filled 2023-10-23: qty 2

## 2023-10-23 MED ORDER — PANTOPRAZOLE SODIUM 20 MG PO TBEC: 20.0000 mg | DELAYED_RELEASE_TABLET | Freq: Every day | ORAL | 2 refills | Status: DC

## 2023-10-23 MED ORDER — LACTATED RINGERS IV BOLUS
1000.0000 mL | Freq: Once | INTRAVENOUS | Status: AC
  Administered 2023-10-23: 1000 mL via INTRAVENOUS

## 2023-10-23 MED ORDER — DIPHENHYDRAMINE HCL 50 MG/ML IJ SOLN
25.0000 mg | Freq: Once | INTRAMUSCULAR | Status: AC
  Administered 2023-10-23: 25 mg via INTRAVENOUS
  Filled 2023-10-23: qty 1

## 2023-10-23 MED ORDER — FAMOTIDINE IN NACL 20-0.9 MG/50ML-% IV SOLN
20.0000 mg | Freq: Once | INTRAVENOUS | Status: AC
  Administered 2023-10-23: 20 mg via INTRAVENOUS
  Filled 2023-10-23: qty 50

## 2023-10-23 NOTE — MAU Provider Note (Signed)
History     CSN: [id number]  Arrival date and time: 10/23/23 1209   Event Date/Time   First Provider Initiated Contact with Patient 10/23/23 1252      Chief Complaint  Patient presents with   Nausea   Emesis   Headache   HPI Lauren [name] is a 27 y.o. G3P0020 at [redacted]w[redacted]d who presents with n/v & headache. Being treated for hyperemesis. Currently taking reglan, protonix & zofran. Finished steroid taper a few weeks ago. Reports good control with only vomiting once daily until yesterday. Vomiting increased yesterday. States she has vomited more than 12 times today. Took reglan & protonix this morning. Hasn't been able to eat or drink today. Reports headache that she hasn't been able to treat due to n/v. Denies abdominal pain, vaginal bleeding, fever, diarrhea.   OB History     Gravida  3   Para  0   Term  0   Preterm  0   AB  2   Living  0      SAB  2   IAB  0   Ectopic  0   Multiple  0   Live Births  0           Past Medical History:  Diagnosis Date   Anxiety    Depression    Dysrhythmia    Migraines    UTI (urinary tract infection)     Past Surgical History:  Procedure Laterality Date   ADENOIDECTOMY W/ MYRINGOTOMY  5169   27 Years old    TONSILLECTOMY  1757   27 Years old    TYMPANOSTOMY TUBE PLACEMENT Bilateral 1999, 20070, 4437   6 weeks old, 27 year old and 27 years old    WISDOM TOOTH EXTRACTION  2077   27 Years old     Family History  Problem Relation Age of Onset   Migraines Mother    Menstrual problems Mother    Diabetes Mother    Anxiety disorder Mother        xanax   Depression Mother        prozac   Emphysema Mother    COPD Father    Bipolar disorder Father    Thyroid disease Sister    Febrile seizures Brother        Died at the age of 27 years old   Diabetes Maternal Grandmother    Dementia Maternal Grandmother        Died at 53   Diabetes Maternal Grandfather    Emphysema Maternal Grandfather        Died at 95    Pneumonia Paternal Grandmother        Died at 22   Heart failure Paternal Grandfather        Died at 25    Social History   Tobacco Use   Smoking status: Former    Current packs/day: 0.00    Types: Cigarettes    Quit date: 2021    Years since quitting: 3.9   Smokeless tobacco: Never   Tobacco comments:    Mom smokes   Vaping Use   Vaping status: Former   Substances: Nicotine  Substance Use Topics   Alcohol use: Not Currently    Comment: occ   Drug use: No    Allergies:  Allergies  Allergen Reactions   Codeine Palpitations and Other (See Comments)    Speeds up heart rate   Prednisone Other (See Comments)    Extreme crying  Back breaks out    Septra [Bactrim] Nausea And Vomiting   Sulfa Antibiotics Anaphylaxis and Nausea And Vomiting   Phenergan [Promethazine]     Bad side effects, muscle spasms and legs jerking   Other Itching, Swelling and Rash    TACO SEASONING (MARCUM BRAND FROM [employer])   Tape Dermatitis    Red and irritation     Medications Prior to Admission  Medication Sig Dispense Refill Last Dose/Taking   methylPREDNISolone (MEDROL) 4 MG tablet Take 1 tablet (4 mg total) by mouth daily. 7 tablet 0 Past Week   ondansetron (ZOFRAN) 4 MG tablet Take 1 tablet (4 mg total) by mouth every 6 (six) hours as needed for nausea or vomiting. 30 tablet 2 10/22/2023   pantoprazole (PROTONIX) 20 MG tablet Take 1 tablet (20 mg total) by mouth daily. 30 tablet 0 10/23/2023   [DISCONTINUED] metoCLOPramide (REGLAN) 10 MG tablet Take 1 tablet (10 mg total) by mouth every 8 (eight) hours as needed for nausea. 30 tablet 0 10/23/2023   [DISCONTINUED] pantoprazole (PROTONIX) 20 MG tablet Take 1 tablet (20 mg total) by mouth daily. 60 tablet 2 10/23/2023   acetaminophen (TYLENOL) 325 MG tablet Take 500 mg by mouth every 6 (six) hours as needed for headache.      cetirizine (ZYRTEC) 10 MG tablet Take 10 mg by mouth daily.   Unknown   guaifenesin (ROBITUSSIN) 100 MG/5ML syrup  Take 200 mg by mouth 3 (three) times daily as needed for cough.   More than a month   loratadine-pseudoephedrine (CLARITIN-D 12-HOUR) 5-120 MG tablet Take by mouth. (Patient not taking: Reported on 11/05/2022)   Unknown   promethazine-dextromethorphan (PROMETHAZINE-DM) 6.25-15 MG/5ML syrup Take 5 mLs by mouth at bedtime as needed for cough. 118 mL 0 Unknown   pyridOXINE (VITAMIN B6) 50 MG tablet Take 1 tablet (50 mg total) by mouth daily. 30 tablet 1 Unknown    Review of Systems  All other systems reviewed and are negative.  Physical Exam   Blood pressure 113/82, pulse 99, temperature 97.9 F (36.6 C), temperature source Oral, resp. rate 17, height 5\' 4"  (1.626 m), weight 62.1 kg, last menstrual period 10/24/2022, SpO2 97%, unknown if currently breastfeeding.  Physical Exam Vitals and nursing note reviewed.  Constitutional:      General: She is not in acute distress.    Appearance: She is well-developed. She is not ill-appearing.  HENT:     Head: Normocephalic and atraumatic.  Eyes:     General: No scleral icterus.       Right eye: No discharge.        Left eye: No discharge.     Conjunctiva/sclera: Conjunctivae normal.  Pulmonary:     Effort: Pulmonary effort is normal. No respiratory distress.  Neurological:     General: No focal deficit present.     Mental Status: She is alert.  Psychiatric:        Mood and Affect: Mood normal.        Behavior: Behavior normal.     MAU Course  Procedures Results for orders placed or performed during the hospital encounter of 10/23/23 (from the past 24 hours)  Urinalysis, Routine w reflex microscopic -Urine, Clean Catch     Status: Abnormal   Collection Time: 10/23/23 12:38 PM  Result Value Ref Range   Color, Urine AMBER (A) YELLOW   APPearance HAZY (A) CLEAR   Specific Gravity, Urine 1.030 1.005 - 1.030   pH 5.0 5.0 - 8.0  Glucose, UA NEGATIVE NEGATIVE mg/dL   Hgb urine dipstick NEGATIVE NEGATIVE   Bilirubin Urine NEGATIVE  NEGATIVE   Ketones, ur 80 (A) NEGATIVE mg/dL   Protein, ur 30 (A) NEGATIVE mg/dL   Nitrite NEGATIVE NEGATIVE   Leukocytes,Ua NEGATIVE NEGATIVE   RBC / HPF 0-5 0 - 5 RBC/hpf   WBC, UA 0-5 0 - 5 WBC/hpf   Bacteria, UA FEW (A) NONE SEEN   Squamous Epithelial / HPF 6-10 0 - 5 /HPF   Mucus PRESENT     MDM FHT present via doppler  Treated in MAU with IV fluids, reglan, benadryl, & protonix. Reports improvement in nausea & resolution of headache.   Assessment and Plan   1. Hyperemesis arising during pregnancy   2. [redacted] weeks gestation of pregnancy    -Refills for meds sent to pharmacy -Patient interested in transferring care. Given list of providers. -Reviewed reasons to return to MAU  Judeth Horn 10/23/2023, 3:17 PM

## 2023-10-23 NOTE — Discharge Instructions (Signed)
  [facility] Providers          [facility]  [address], [place], [place] [address]  [phone]  [facility]  [address], [place], [place] [address]  [phone]  [facility]  [address], [place], [place] [address]  [phone]  [facility] [address], [place], [place] [address] [phone]  [facility] [place]  [address], [place], [place] [address]  [phone]  [facility]  [address]First floor), [place], [place] [address]  [phone]  [facility]  [address], [place], [place] [address]  [phone]  [facility]  [address], [place], [place] [address]  [phone]  [facility]  [address], [place], [place] [address]  [phone]  [facility]  [address], [place], [place] [address]  [phone]  [facility]  [address], [place], [place] [address]  [phone]  [facility]  [address], [place], [place] [address]  [phone]  [facility]   [address], [place], [place] [address]  [phone]  [facility]  [address], [place], [place] [address]   [phone]  [facility] [address], [place], [place] [address] [phone]  [facility]  [address], , [place] [address]  [phone]         

## 2023-10-23 NOTE — MAU Note (Signed)
Lauren [name] is a 27 y.o. at [redacted]w[redacted]d here in MAU reporting: hasn't been able to keep anything down for 2 days.   Woke up at 0900- has thrown up like 9 times.  Upper abd is sore  Onset of complaint: 2 days Pain score: 7, HA 9 Vitals:   10/23/23 1226  BP: 106/84  Pulse: 80  Resp: 16  Temp: 99.3 F (37.4 C)  SpO2: 98%      Lab orders placed from triage:  urine

## 2023-11-10 NOTE — L&D Delivery Note (Signed)
 Delivery Note At 5:53 PM a viable female was delivered via Vaginal, Spontaneous (Presentation: Left Occiput Anterior).  APGAR: 7, 9; weight 7 lb 5.8 oz (3340 g).   Placenta status: Spontaneous, Intact.  Cord: 3 vessels with the following complications: None.  Cord pH: not sent  Head delivered to perineum.  Nuchal times two reduced.  Mild right shoulder dystocia reduced with suprapubic pressure.  Was three seconds total.  Body delivered and placed on mothers abdomen.    Anesthesia: Epidural Episiotomy: None Lacerations: 2nd degree;Perineal;1st degree;Periurethral Suture Repair: 2.0 chromic Est. Blood Loss (mL): 172  Mom to postpartum.  Baby to Couplet care / Skin to Skin.  Lauren [name] A Lauren [name] 04/11/2024, 7:20 PM

## 2024-01-27 LAB — OB RESULTS CONSOLE RPR: RPR: NONREACTIVE

## 2024-01-27 LAB — OB RESULTS CONSOLE HIV ANTIBODY (ROUTINE TESTING): HIV: NONREACTIVE

## 2024-03-23 LAB — OB RESULTS CONSOLE GBS: GBS: NEGATIVE

## 2024-04-11 ENCOUNTER — Encounter (HOSPITAL_COMMUNITY): Payer: Self-pay | Admitting: Obstetrics and Gynecology

## 2024-04-11 ENCOUNTER — Inpatient Hospital Stay (HOSPITAL_COMMUNITY): Admitting: Anesthesiology

## 2024-04-11 ENCOUNTER — Other Ambulatory Visit: Payer: Self-pay

## 2024-04-11 ENCOUNTER — Inpatient Hospital Stay (HOSPITAL_COMMUNITY)
Admission: AD | Admit: 2024-04-11 | Discharge: 2024-04-13 | DRG: 807 | Disposition: A | Discharge status: Home / Self Care | Attending: Obstetrics and Gynecology | Admitting: Obstetrics and Gynecology

## 2024-04-11 DIAGNOSIS — O4292 Full-term premature rupture of membranes, unspecified as to length of time between rupture and onset of labor: Secondary | ICD-10-CM | POA: Diagnosis present

## 2024-04-11 DIAGNOSIS — Z833 Family history of diabetes mellitus: Secondary | ICD-10-CM

## 2024-04-11 DIAGNOSIS — Z8249 Family history of ischemic heart disease and other diseases of the circulatory system: Secondary | ICD-10-CM

## 2024-04-11 DIAGNOSIS — O26893 Other specified pregnancy related conditions, third trimester: Secondary | ICD-10-CM | POA: Diagnosis present

## 2024-04-11 DIAGNOSIS — Z3A38 38 weeks gestation of pregnancy: Secondary | ICD-10-CM

## 2024-04-11 DIAGNOSIS — O9902 Anemia complicating childbirth: Secondary | ICD-10-CM | POA: Diagnosis present

## 2024-04-11 DIAGNOSIS — O4290 Premature rupture of membranes, unspecified as to length of time between rupture and onset of labor, unspecified weeks of gestation: Secondary | ICD-10-CM | POA: Diagnosis present

## 2024-04-11 DIAGNOSIS — Z87891 Personal history of nicotine dependence: Secondary | ICD-10-CM

## 2024-04-11 HISTORY — DX: Encounter for screening for other viral diseases: B00.9

## 2024-04-11 LAB — CBC
HCT: 32.4 % — ABNORMAL LOW (ref 36.0–46.0)
Hemoglobin: 10.1 g/dL — ABNORMAL LOW (ref 12.0–15.0)
MCH: 24.7 pg — ABNORMAL LOW (ref 26.0–34.0)
MCHC: 31.2 g/dL (ref 30.0–36.0)
MCV: 79.2 fL — ABNORMAL LOW (ref 80.0–100.0)
Platelets: 177 10*3/uL (ref 150–400)
RBC: 4.09 MIL/uL (ref 3.87–5.11)
RDW: 15.2 % (ref 11.5–15.5)
WBC: 11 10*3/uL — ABNORMAL HIGH (ref 4.0–10.5)
nRBC: 0.2 % (ref 0.0–0.2)

## 2024-04-11 LAB — PROTEIN / CREATININE RATIO, URINE
Creatinine, Urine: 103 mg/dL
Protein Creatinine Ratio: 0.17 mg/mg{creat} — ABNORMAL HIGH (ref 0.00–0.15)
Total Protein, Urine: 17 mg/dL

## 2024-04-11 LAB — RPR
RPR Ser Ql: REACTIVE — AB
RPR Titer: 1:1 {titer}

## 2024-04-11 LAB — TYPE AND SCREEN
ABO/RH(D): O POS
Antibody Screen: NEGATIVE

## 2024-04-11 LAB — POCT FERN TEST: POCT Fern Test: POSITIVE

## 2024-04-11 MED ORDER — SIMETHICONE 80 MG PO CHEW
80.0000 mg | CHEWABLE_TABLET | ORAL | Status: DC | PRN
Start: 2024-04-11 — End: 2024-04-13

## 2024-04-11 MED ORDER — WITCH HAZEL-GLYCERIN EX PADS: 1.0000 | MEDICATED_PAD | CUTANEOUS | Status: DC | PRN

## 2024-04-11 MED ORDER — LORATADINE 10 MG PO TABS
10.0000 mg | ORAL_TABLET | Freq: Every day | ORAL | Status: DC
  Administered 2024-04-12 – 2024-04-13 (×2): 10 mg via ORAL
  Filled 2024-04-11 (×2): qty 1

## 2024-04-11 MED ORDER — LACTATED RINGERS IV SOLN: 500.0000 mL | Freq: Once | INTRAVENOUS | Status: DC

## 2024-04-11 MED ORDER — COCONUT OIL OIL: 1.0000 | TOPICAL_OIL | Status: DC | PRN

## 2024-04-11 MED ORDER — PHENYLEPHRINE 80 MCG/ML (10ML) SYRINGE FOR IV PUSH (FOR BLOOD PRESSURE SUPPORT): 80.0000 ug | PREFILLED_SYRINGE | INTRAVENOUS | Status: DC | PRN

## 2024-04-11 MED ORDER — OXYTOCIN-SODIUM CHLORIDE 30-0.9 UT/500ML-% IV SOLN
2.5000 [IU]/h | INTRAVENOUS | Status: DC
Start: 2024-04-11 — End: 2024-04-11

## 2024-04-11 MED ORDER — OXYCODONE-ACETAMINOPHEN 5-325 MG PO TABS: 1.0000 | ORAL_TABLET | ORAL | Status: DC | PRN

## 2024-04-11 MED ORDER — EPHEDRINE 5 MG/ML INJ
10.0000 mg | INTRAVENOUS | Status: DC | PRN
  Filled 2024-04-11: qty 5

## 2024-04-11 MED ORDER — FENTANYL-BUPIVACAINE-NACL 0.5-0.125-0.9 MG/250ML-% EP SOLN
12.0000 mL/h | EPIDURAL | Status: DC | PRN
  Administered 2024-04-11: 12 mL/h via EPIDURAL
  Filled 2024-04-11: qty 250

## 2024-04-11 MED ORDER — OXYTOCIN-SODIUM CHLORIDE 30-0.9 UT/500ML-% IV SOLN
1.0000 m[IU]/min | INTRAVENOUS | Status: DC
  Administered 2024-04-11: 2 m[IU]/min via INTRAVENOUS
  Filled 2024-04-11: qty 500

## 2024-04-11 MED ORDER — SOD CITRATE-CITRIC ACID 500-334 MG/5ML PO SOLN: 30.0000 mL | ORAL | Status: DC | PRN

## 2024-04-11 MED ORDER — SENNOSIDES-DOCUSATE SODIUM 8.6-50 MG PO TABS
2.0000 | ORAL_TABLET | Freq: Every day | ORAL | Status: DC
  Administered 2024-04-12 – 2024-04-13 (×2): 2 via ORAL
  Filled 2024-04-11 (×2): qty 2

## 2024-04-11 MED ORDER — ZOLPIDEM TARTRATE 5 MG PO TABS: 5.0000 mg | ORAL_TABLET | Freq: Every evening | ORAL | Status: DC | PRN

## 2024-04-11 MED ORDER — LACTATED RINGERS IV SOLN: 500.0000 mL | INTRAVENOUS | Status: DC | PRN

## 2024-04-11 MED ORDER — OXYTOCIN BOLUS FROM INFUSION
333.0000 mL | Freq: Once | INTRAVENOUS | Status: AC
  Administered 2024-04-11: 333 mL via INTRAVENOUS

## 2024-04-11 MED ORDER — ONDANSETRON HCL 4 MG/2ML IJ SOLN
4.0000 mg | Freq: Four times a day (QID) | INTRAMUSCULAR | Status: DC | PRN
  Administered 2024-04-11: 4 mg via INTRAVENOUS
  Filled 2024-04-11: qty 2

## 2024-04-11 MED ORDER — ONDANSETRON HCL 4 MG/2ML IJ SOLN
4.0000 mg | INTRAMUSCULAR | Status: DC | PRN
Start: 2024-04-11 — End: 2024-04-13

## 2024-04-11 MED ORDER — NIFEDIPINE ER OSMOTIC RELEASE 30 MG PO TB24
30.0000 mg | ORAL_TABLET | Freq: Once | ORAL | Status: AC
  Administered 2024-04-11: 30 mg via ORAL
  Filled 2024-04-11: qty 1

## 2024-04-11 MED ORDER — VALACYCLOVIR HCL 500 MG PO TABS
1000.0000 mg | ORAL_TABLET | Freq: Two times a day (BID) | ORAL | Status: DC
  Administered 2024-04-11 – 2024-04-13 (×4): 1000 mg via ORAL
  Filled 2024-04-11 (×4): qty 2

## 2024-04-11 MED ORDER — PRENATAL MULTIVITAMIN CH
1.0000 | ORAL_TABLET | Freq: Every day | ORAL | Status: DC
  Administered 2024-04-12 – 2024-04-13 (×2): 1 via ORAL
  Filled 2024-04-11 (×2): qty 1

## 2024-04-11 MED ORDER — ACETAMINOPHEN 325 MG PO TABS
650.0000 mg | ORAL_TABLET | ORAL | Status: DC | PRN
Start: 2024-04-11 — End: 2024-04-13

## 2024-04-11 MED ORDER — PANTOPRAZOLE SODIUM 20 MG PO TBEC
20.0000 mg | DELAYED_RELEASE_TABLET | Freq: Every day | ORAL | Status: DC
  Administered 2024-04-12 – 2024-04-13 (×2): 20 mg via ORAL
  Filled 2024-04-11 (×2): qty 1

## 2024-04-11 MED ORDER — BENZOCAINE-MENTHOL 20-0.5 % EX AERO: 1.0000 | INHALATION_SPRAY | CUTANEOUS | Status: DC | PRN

## 2024-04-11 MED ORDER — ONDANSETRON HCL 4 MG PO TABS: 4.0000 mg | ORAL_TABLET | ORAL | Status: DC | PRN

## 2024-04-11 MED ORDER — ACETAMINOPHEN 325 MG PO TABS: 650.0000 mg | ORAL_TABLET | ORAL | Status: DC | PRN

## 2024-04-11 MED ORDER — LIDOCAINE HCL (PF) 1 % IJ SOLN
30.0000 mL | INTRAMUSCULAR | Status: AC | PRN
  Administered 2024-04-11: 30 mL via SUBCUTANEOUS
  Filled 2024-04-11: qty 30

## 2024-04-11 MED ORDER — TETANUS-DIPHTH-ACELL PERTUSSIS 5-2.5-18.5 LF-MCG/0.5 IM SUSY: 0.5000 mL | PREFILLED_SYRINGE | Freq: Once | INTRAMUSCULAR | Status: DC

## 2024-04-11 MED ORDER — OXYCODONE-ACETAMINOPHEN 5-325 MG PO TABS: 2.0000 | ORAL_TABLET | ORAL | Status: DC | PRN

## 2024-04-11 MED ORDER — TERBUTALINE SULFATE 1 MG/ML IJ SOLN: 0.2500 mg | Freq: Once | INTRAMUSCULAR | Status: DC | PRN

## 2024-04-11 MED ORDER — EPHEDRINE 5 MG/ML INJ: 10.0000 mg | INTRAVENOUS | Status: DC | PRN

## 2024-04-11 MED ORDER — PHENYLEPHRINE 80 MCG/ML (10ML) SYRINGE FOR IV PUSH (FOR BLOOD PRESSURE SUPPORT)
80.0000 ug | PREFILLED_SYRINGE | INTRAVENOUS | Status: DC | PRN
  Filled 2024-04-11: qty 10

## 2024-04-11 MED ORDER — DIPHENHYDRAMINE HCL 50 MG/ML IJ SOLN: 12.5000 mg | INTRAMUSCULAR | Status: DC | PRN

## 2024-04-11 MED ORDER — FENTANYL CITRATE (PF) 100 MCG/2ML IJ SOLN
100.0000 ug | INTRAMUSCULAR | Status: DC | PRN
  Administered 2024-04-11: 100 ug via INTRAVENOUS
  Filled 2024-04-11: qty 2

## 2024-04-11 MED ORDER — LACTATED RINGERS IV SOLN: INTRAVENOUS | Status: DC

## 2024-04-11 MED ORDER — DIBUCAINE (PERIANAL) 1 % EX OINT: 1.0000 | TOPICAL_OINTMENT | CUTANEOUS | Status: DC | PRN

## 2024-04-11 MED ORDER — IBUPROFEN 600 MG PO TABS
600.0000 mg | ORAL_TABLET | Freq: Four times a day (QID) | ORAL | Status: DC
  Administered 2024-04-11 – 2024-04-13 (×7): 600 mg via ORAL
  Filled 2024-04-11 (×7): qty 1

## 2024-04-11 MED ORDER — LIDOCAINE HCL (PF) 1 % IJ SOLN
INTRAMUSCULAR | Status: DC | PRN
  Administered 2024-04-11: 3 mL via EPIDURAL
  Administered 2024-04-11: 5 mL via EPIDURAL
  Administered 2024-04-11: 2 mL via EPIDURAL

## 2024-04-11 MED ORDER — DIPHENHYDRAMINE HCL 25 MG PO CAPS
25.0000 mg | ORAL_CAPSULE | Freq: Four times a day (QID) | ORAL | Status: DC | PRN
Start: 2024-04-11 — End: 2024-04-13

## 2024-04-11 NOTE — MAU Note (Signed)
 Pt informed that the ultrasound is considered a limited OB ultrasound and is not intended to be a complete ultrasound exam.  Patient also informed that the ultrasound is not being completed with the intent of assessing for fetal or placental anomalies or any pelvic abnormalities.  Explained that the purpose of today's ultrasound is to assess for  fetal presentation.  Patient acknowledges the purpose of the exam and the limitations of the study.     US -- Vertex

## 2024-04-11 NOTE — Anesthesia Procedure Notes (Signed)
 Epidural Patient location during procedure: OB Start time: 04/11/2024 4:00 PM End time: 04/11/2024 4:08 PM  Staffing Anesthesiologist: Peggy Bowens, MD Performed: anesthesiologist   Preanesthetic Checklist Completed: patient identified, IV checked, site marked, risks and benefits discussed, surgical consent, monitors and equipment checked, pre-op evaluation and timeout performed  Epidural Patient position: sitting Prep: DuraPrep and site prepped and draped Patient monitoring: continuous pulse ox and blood pressure Approach: midline Location: L3-L4 Injection technique: LOR air  Needle:  Needle type: Tuohy  Needle gauge: 17 G Needle length: 9 cm and 9 Needle insertion depth: 6 cm Catheter type: closed end flexible Catheter size: 19 Gauge Catheter at skin depth: 11 cm Test dose: negative  Assessment Events: blood not aspirated, no cerebrospinal fluid, injection not painful, no injection resistance, no paresthesia and negative IV test

## 2024-04-11 NOTE — Anesthesia Preprocedure Evaluation (Signed)
 Anesthesia Evaluation  Patient identified by MRN, date of birth, ID band Patient awake    Reviewed: Allergy & Precautions, Patient's Chart, lab work & pertinent test results  Airway Mallampati: II  TM Distance: >3 FB     Dental   Pulmonary former smoker   Pulmonary exam normal        Cardiovascular negative cardio ROS Normal cardiovascular exam     Neuro/Psych  Headaches    GI/Hepatic negative GI ROS, Neg liver ROS,,,  Endo/Other  negative endocrine ROS    Renal/GU negative Renal ROS     Musculoskeletal   Abdominal   Peds  Hematology  (+) Blood dyscrasia, anemia   Anesthesia Other Findings   Reproductive/Obstetrics (+) Pregnancy                             Anesthesia Physical Anesthesia Plan  ASA: 2  Anesthesia Plan: Epidural   Post-op Pain Management:    Induction:   PONV Risk Score and Plan: 2 and Treatment may vary due to age or medical condition  Airway Management Planned: Natural Airway  Additional Equipment:   Intra-op Plan:   Post-operative Plan:   Informed Consent: I have reviewed the patients History and Physical, chart, labs and discussed the procedure including the risks, benefits and alternatives for the proposed anesthesia with the patient or authorized representative who has indicated his/her understanding and acceptance.       Plan Discussed with:   Anesthesia Plan Comments:        Anesthesia Quick Evaluation

## 2024-04-11 NOTE — MAU Note (Signed)
 Lauren [name] is a 28 y.o. at [redacted]w[redacted]d here in MAU reporting: water broke about 6:30. Clear fluid. Reports cramping every few minutes  LMP:  Onset of complaint: 6:30  Pain score: 6 Vitals:   04/11/24 0832  BP: 134/89  Pulse: (!) 111  Resp: 18  Temp: 98.1 F (36.7 C)     FHT:   Lab orders placed from triage: labor eval , [employer] 

## 2024-04-11 NOTE — H&P (Signed)
 Lauren [name] is a 28 y.o. female presenting for SROM clear fluid.  Good FM. OB History     Gravida  3   Para  1   Term  1   Preterm  0   AB  2   Living  1      SAB  2   IAB  0   Ectopic  0   Multiple  0   Live Births  1          Past Medical History:  Diagnosis Date   Anxiety    Depression    Dysrhythmia    HSV antigen DIF positive    Migraines    UTI (urinary tract infection)    Past Surgical History:  Procedure Laterality Date   ADENOIDECTOMY W/ MYRINGOTOMY  1068   28 Years old    TONSILLECTOMY  4630   28 Years old    TYMPANOSTOMY TUBE PLACEMENT Bilateral 1999, 20028, 5988   57 weeks old, 28 year old and 28 years old    WISDOM TOOTH EXTRACTION  3934   28 Years old    Family History: family history includes Anxiety disorder in her mother; Bipolar disorder in her father; COPD in her father; Dementia in her maternal grandmother; Depression in her mother; Diabetes in her maternal grandfather, maternal grandmother, and mother; Emphysema in her maternal grandfather and mother; Febrile seizures in her brother; Heart failure in her paternal grandfather; Menstrual problems in her mother; Migraines in her mother; Pneumonia in her paternal grandmother; Thyroid  disease in her sister. Social History:  reports that she quit smoking about 4 years ago. Her smoking use included cigarettes. She has never used smokeless tobacco. She reports that she does not currently use alcohol. She reports that she does not use drugs.     Maternal Diabetes: No Genetic Screening: Normal Maternal Ultrasounds/Referrals: Normal Fetal Ultrasounds or other Referrals:  None Maternal Substance Abuse:  No Significant Maternal Medications:  None Significant Maternal Lab Results:  Group B Strep negative Number of Prenatal Visits:greater than 3 verified prenatal visits Maternal Vaccinations Other Comments:  None  Review of Systems History Physical Examination: General appearance - alert, well  appearing, and in no distress Chest - clear to auscultation, no wheezes, rales or rhonchi, symmetric air entry Heart - normal rate and regular rhythm Abdomen - soft, nontender, nondistended, no masses or organomegaly gravid Extremities - peripheral pulses normal, no pedal edema, no clubbing or cyanosis, Homan's sign negative bilaterally  Dilation: 10 Effacement (%): 100 Station: Plus 1, Plus 2 Exam by:: Georgine Kitchens, RN Blood pressure 104/86, pulse (!) 116, temperature 98.1 F (36.7 C), temperature source Oral, resp. rate 18, height 5\' 4"  (1.626 m), weight 91.2 kg, last menstrual period 10/24/2022, SpO2 96%, unknown if currently breastfeeding. Exam Physical Exam  Prenatal labs: ABO, Rh: --/--/O POS (06/03 9147) Antibody: NEG (06/03 8295) Rubella: Immune (10/25 0000) RPR: Reactive (06/03 0922)  HBsAg: Negative (10/25 0000)  HIV: Non-reactive (03/20 0000)  GBS: Negative/-- (05/15 0000)   Assessment/Plan: SROM at term Elevated BPS  Labs WNL Pitocin augmentation Anticipate SVD   Tahsin Benyo A Vonda Harth 04/11/2024, 7:03 PM

## 2024-04-12 LAB — COMPREHENSIVE METABOLIC PANEL WITH GFR
ALT: 13 U/L (ref 0–44)
AST: 23 U/L (ref 15–41)
Albumin: 2 g/dL — ABNORMAL LOW (ref 3.5–5.0)
Alkaline Phosphatase: 83 U/L (ref 38–126)
Anion gap: 8 (ref 5–15)
BUN: 6 mg/dL (ref 6–20)
CO2: 21 mmol/L — ABNORMAL LOW (ref 22–32)
Calcium: 8.8 mg/dL — ABNORMAL LOW (ref 8.9–10.3)
Chloride: 107 mmol/L (ref 98–111)
Creatinine, Ser: 0.58 mg/dL (ref 0.44–1.00)
GFR, Estimated: >60 mL/min (ref >60)
Glucose, Bld: 106 mg/dL — ABNORMAL HIGH (ref 70–99)
Potassium: 3.5 mmol/L (ref 3.5–5.1)
Sodium: 136 mmol/L (ref 135–145)
Total Bilirubin: 0.8 mg/dL (ref 0.0–1.2)
Total Protein: 5.1 g/dL — ABNORMAL LOW (ref 6.5–8.1)

## 2024-04-12 LAB — CBC
HCT: 27.9 % — ABNORMAL LOW (ref 36.0–46.0)
Hemoglobin: 8.8 g/dL — ABNORMAL LOW (ref 12.0–15.0)
MCH: 25.1 pg — ABNORMAL LOW (ref 26.0–34.0)
MCHC: 31.5 g/dL (ref 30.0–36.0)
MCV: 79.5 fL — ABNORMAL LOW (ref 80.0–100.0)
Platelets: 172 10*3/uL (ref 150–400)
RBC: 3.51 MIL/uL — ABNORMAL LOW (ref 3.87–5.11)
RDW: 15.5 % (ref 11.5–15.5)
WBC: 14.7 10*3/uL — ABNORMAL HIGH (ref 4.0–10.5)
nRBC: 0 % (ref 0.0–0.2)

## 2024-04-12 LAB — T.PALLIDUM AB, TOTAL: T Pallidum Abs: NONREACTIVE

## 2024-04-12 MED ORDER — FERROUS SULFATE 325 (65 FE) MG PO TABS
325.0000 mg | ORAL_TABLET | ORAL | Status: DC
  Administered 2024-04-12: 325 mg via ORAL
  Filled 2024-04-12: qty 1

## 2024-04-12 NOTE — Anesthesia Postprocedure Evaluation (Signed)
 Anesthesia Post Note  Patient: Lauren [name]  Procedure(s) Performed: AN AD HOC LABOR EPIDURAL     Patient location during evaluation: Mother Baby Anesthesia Type: Epidural Level of consciousness: awake and alert Pain management: pain level controlled Vital Signs Assessment: post-procedure vital signs reviewed and stable Respiratory status: spontaneous breathing, nonlabored ventilation and respiratory function stable Cardiovascular status: stable Postop Assessment: no headache, no backache and epidural receding Anesthetic complications: no   No notable events documented.  Last Vitals:  Vitals:   04/11/24 2343 04/12/24 0514  BP: 114/76 107/65  Pulse: 99 98  Resp: 18 18  Temp: 36.9 C 37 C  SpO2: 96% 98%    Last Pain:  Vitals:   04/12/24 0514  TempSrc: Oral  PainSc:    Pain Goal:                   [facility]

## 2024-04-12 NOTE — Progress Notes (Signed)
 Post Partum Day 1 Subjective: no complaints, up ad lib, voiding, tolerating PO, and + flatus  Objective: Blood pressure 112/85, pulse 94, temperature 98.6 F (37 C), temperature source Oral, resp. rate 17, height 5\' 4"  (1.626 m), weight 91.2 kg, last menstrual period 10/24/2022, SpO2 96%, unknown if currently breastfeeding.  Physical Exam:  General: alert, cooperative, and no distress Lochia: appropriate Uterine Fundus: FF, NT Incision: n/a DVT Evaluation: no calf tenderness  Recent Labs    04/11/24 0922 04/12/24 0544  HGB 10.1* 8.8*  HCT 32.4* 27.9*    Assessment/Plan: Plan for discharge tomorrow and Circumcision prior to discharge Bottle feeding Mild anemia - iron every other day Cont routine pp care   LOS: 1 day   Madelene Schanz, MD 04/12/2024, 4:51 PM

## 2024-04-13 LAB — BIRTH TISSUE RECOVERY COLLECTION (PLACENTA DONATION)

## 2024-04-13 MED ORDER — FERROUS SULFATE 325 (65 FE) MG PO TABS: 325.0000 mg | ORAL_TABLET | ORAL | 1 refills | Status: AC

## 2024-04-13 MED ORDER — HYDROCHLOROTHIAZIDE 12.5 MG PO TABS: 12.5000 mg | ORAL_TABLET | Freq: Every day | ORAL | 0 refills | Status: AC

## 2024-04-13 MED ORDER — ACETAMINOPHEN 325 MG PO TABS: 650.0000 mg | ORAL_TABLET | Freq: Four times a day (QID) | ORAL | 1 refills | Status: AC | PRN

## 2024-04-13 MED ORDER — IBUPROFEN 600 MG PO TABS: 600.0000 mg | ORAL_TABLET | Freq: Four times a day (QID) | ORAL | 1 refills | Status: AC | PRN

## 2024-04-13 NOTE — Discharge Summary (Signed)
 Postpartum Discharge Summary  Date of Service updated 04-13-24     Patient Name: Lauren [name] DOB: Sep 09, 1996 MRN: [id number]  Date of admission: 04/11/2024 Delivery date:04/11/2024 Delivering provider: Marylu Soda Date of discharge: 04/13/2024  Admitting diagnosis: PROM (premature rupture of membranes) [O42.90] Intrauterine pregnancy: [redacted]w[redacted]d     Secondary diagnosis:  Active Problems:   PROM (premature rupture of membranes)  Additional problems: mildly elevated BPs    Discharge diagnosis: Term Pregnancy Delivered                                              Post partum procedures:n/a Augmentation: Pitocin Complications: None  Hospital course: Onset of Labor With Vaginal Delivery      28 y.o. yo [id number] at [redacted]w[redacted]d was admitted in Active Labor on 04/11/2024. Labor course was complicated by nothing  Membrane Rupture Time/Date: 6:30 AM,04/11/2024  Delivery Method:Vaginal, Spontaneous Operative Delivery:N/A Episiotomy: None Lacerations:  2nd degree;Perineal;1st degree;Periurethral Patient had a postpartum course complicated by nothing.  She is ambulating, tolerating a regular diet, passing flatus, and urinating well. Patient is discharged home in stable condition on 04/13/24.  Newborn Data: Birth date:04/11/2024 Birth time:5:53 PM Gender:Female Living status:Living Apgars:7 ,9  Weight:3340 g  Magnesium Sulfate received: No BMZ received: No  Immunizations administered: Immunization History  Administered Date(s) Administered   DTaP 02/18/1998, 07/08/1998, 05/20/1999, 07/22/1999, 07/13/2003   HIB (PRP-OMP) 02/18/1998, 07/08/1998, 05/20/1999   HPV Quadrivalent 06/27/2010, 09/25/2010, 09/17/2011   Hepatitis A 03/09/2007, 09/28/2007   Hepatitis B 1998-10-19, 01/17/1998, 09/26/1998   IPV 02/18/1998, 07/08/1998, 05/20/1999, 07/13/2003   Influenza Split 11/23/2004, 08/01/2008, 09/07/2009, 09/25/2010, 09/17/2011, 08/01/2012   MMR 05/20/1999, 07/13/2003   Meningococcal Conjugate  07/17/2009   Td 06/14/2009   Tdap 06/14/2009    Physical exam  Vitals:   04/12/24 0845 04/12/24 1500 04/12/24 2008 04/13/24 0610  BP: 115/68 112/85 123/84 128/82  Pulse: 97 94 97 92  Resp: 17 17 18 18   Temp: 98.2 F (36.8 C) 98.6 F (37 C) 98.3 F (36.8 C) 98.2 F (36.8 C)  TempSrc: Oral Oral Oral Oral  SpO2: 98% 96%  99%  Weight:      Height:       General: alert, cooperative, and no distress Lochia: appropriate Uterine Fundus: FF, NT Incision: N/A DVT Evaluation: no calf tenderness, 1-2+ edema Labs: Lab Results  Component Value Date   WBC 14.7 (H) 04/12/2024   HGB 8.8 (L) 04/12/2024   HCT 27.9 (L) 04/12/2024   MCV 79.5 (L) 04/12/2024   PLT 172 04/12/2024      Latest Ref Rng & Units 04/12/2024    5:44 AM  CMP  Glucose 70 - 99 mg/dL 956   BUN 6 - 20 mg/dL 6   Creatinine 3.87 - 5.64 mg/dL 3.32   Sodium 951 - 884 mmol/L 136   Potassium 3.5 - 5.1 mmol/L 3.5   Chloride 98 - 111 mmol/L 107   CO2 22 - 32 mmol/L 21   Calcium  8.9 - 10.3 mg/dL 8.8   Total Protein 6.5 - 8.1 g/dL 5.1   Total Bilirubin 0.0 - 1.2 mg/dL 0.8   Alkaline Phos 38 - 126 U/L 83   AST 15 - 41 U/L 23   ALT 0 - 44 U/L 13    Edinburgh Score:    04/12/2024   11:57 AM  Edinburgh Postnatal Depression Scale Screening Tool  I have been able to laugh and see the funny side of things. 0  I have looked forward with enjoyment to things. 0  I have blamed myself unnecessarily when things went wrong. 0  I have been anxious or worried for no good reason. 0  I have felt scared or panicky for no good reason. 0  Things have been getting on top of me. 0  I have been so unhappy that I have had difficulty sleeping. 0  I have felt sad or miserable. 0  I have been so unhappy that I have been crying. 0  The thought of harming myself has occurred to me. 0  Edinburgh Postnatal Depression Scale Total 0      After visit meds:  Allergies as of 04/13/2024       Reactions   Codeine Palpitations, Other (See  Comments)   Speeds up heart rate   Prednisone  Other (See Comments)   Extreme crying Back breaks out    Septra [bactrim] Nausea And Vomiting   Sulfa Antibiotics Anaphylaxis, Nausea And Vomiting   Phenergan  [promethazine ]    Bad side effects, muscle spasms and legs jerking   Other Itching, Swelling, Rash   TACO SEASONING ([employer] BRAND FROM [employer])   Tape Dermatitis   Red and irritation         Medication List     STOP taking these medications    guaifenesin  100 MG/5ML syrup Commonly known as: ROBITUSSIN   metoCLOPramide  10 MG tablet Commonly known as: REGLAN    ondansetron  4 MG tablet Commonly known as: ZOFRAN    pantoprazole  20 MG tablet Commonly known as: PROTONIX    pyridOXINE  50 MG tablet Commonly known as: VITAMIN B6       TAKE these medications    acetaminophen  325 MG tablet Commonly known as: Tylenol  Take 2 tablets (650 mg total) by mouth every 6 (six) hours as needed for mild pain (pain score 1-3) (for pain scale < 4). What changed:  how much to take reasons to take this   cetirizine  10 MG tablet Commonly known as: ZYRTEC  Take 10 mg by mouth daily.   ferrous sulfate 325 (65 FE) MG tablet Take 1 tablet (325 mg total) by mouth every other day. Start taking on: April 14, 2024   hydrochlorothiazide 12.5 MG tablet Commonly known as: HYDRODIURIL Take 1 tablet (12.5 mg total) by mouth daily for 3 days.   ibuprofen  600 MG tablet Commonly known as: ADVIL  Take 1 tablet (600 mg total) by mouth every 6 (six) hours as needed.   valACYclovir 1000 MG tablet Commonly known as: VALTREX Take 1,000 mg by mouth 2 (two) times daily.         Discharge home in stable condition Infant Feeding: Bottle Infant Disposition:home with mother Discharge instruction: per After Visit Summary and Postpartum booklet. Activity: Advance as tolerated. Pelvic rest for 6 weeks.  Diet: routine diet Anticipated Birth Control: Unsure Postpartum Appointment:6  weeks Additional Postpartum F/U: n/a Future Appointments:No future appointments. Follow up Visit:  Follow-up Information     Ob/[facility]yn, [facility]. Schedule an appointment as soon as possible for a visit in 6 day(s).   Specialty: Obstetrics and Gynecology Why: For Postpartum follow-up Contact information: [address] [place] [address] [phone]                     04/13/2024 Madelene Schanz, MD

## 2024-04-22 ENCOUNTER — Telehealth (HOSPITAL_COMMUNITY): Payer: Self-pay

## 2024-04-22 NOTE — Telephone Encounter (Signed)
 04/22/2024 1400  Name: Lauren [name] MRN: [id number] DOB: March 02, 1996  Reason for Call:  Transition of Care Hospital Discharge Call  Contact Status: Patient Contact Status: Message  Language assistant needed:          Follow-Up Questions:    Dimple Francis Postnatal Depression Scale:  In the Past 7 Days:    PHQ2-9 Depression Scale:     Discharge Follow-up:    Post-discharge interventions: NA  Signature  Wadell Guild
